# Patient Record
Sex: Female | Born: 1945
Health system: Southern US, Community
[De-identification: ages and names within clinical notes are randomized; demographics above are authoritative.]

## PROBLEM LIST (undated history)

## (undated) DIAGNOSIS — K219 Gastro-esophageal reflux disease without esophagitis: Secondary | ICD-10-CM

## (undated) DIAGNOSIS — Z9889 Other specified postprocedural states: Secondary | ICD-10-CM

## (undated) DIAGNOSIS — I10 Essential (primary) hypertension: Secondary | ICD-10-CM

## (undated) DIAGNOSIS — G709 Myoneural disorder, unspecified: Secondary | ICD-10-CM

## (undated) DIAGNOSIS — H269 Unspecified cataract: Secondary | ICD-10-CM

## (undated) DIAGNOSIS — T7840XA Allergy, unspecified, initial encounter: Secondary | ICD-10-CM

## (undated) DIAGNOSIS — M1990 Unspecified osteoarthritis, unspecified site: Secondary | ICD-10-CM

## (undated) DIAGNOSIS — I739 Peripheral vascular disease, unspecified: Secondary | ICD-10-CM

## (undated) DIAGNOSIS — Z5189 Encounter for other specified aftercare: Secondary | ICD-10-CM

## (undated) DIAGNOSIS — N6019 Diffuse cystic mastopathy of unspecified breast: Secondary | ICD-10-CM

## (undated) DIAGNOSIS — M8580 Other specified disorders of bone density and structure, unspecified site: Secondary | ICD-10-CM

## (undated) DIAGNOSIS — E785 Hyperlipidemia, unspecified: Secondary | ICD-10-CM

## (undated) HISTORY — DX: Allergy, unspecified, initial encounter: T78.40XA

## (undated) HISTORY — PX: ABDOMINAL HYSTERECTOMY: SHX81

## (undated) HISTORY — PX: BREAST SURGERY: SHX581

## (undated) HISTORY — DX: Gastro-esophageal reflux disease without esophagitis: K21.9

## (undated) HISTORY — PX: ORIF SHOULDER FRACTURE: SHX5035

## (undated) HISTORY — DX: Myoneural disorder, unspecified: G70.9

## (undated) HISTORY — PX: TONSILLECTOMY: SUR1361

## (undated) HISTORY — DX: Encounter for other specified aftercare: Z51.89

## (undated) HISTORY — PX: HAND SURGERY: SHX662

## (undated) HISTORY — PX: VEIN SURGERY: SHX48

## (undated) HISTORY — DX: Unspecified cataract: H26.9

## (undated) HISTORY — PX: COLONOSCOPY: SHX174

## (undated) HISTORY — PX: OTHER SURGICAL HISTORY: SHX169

## (undated) HISTORY — DX: Hyperlipidemia, unspecified: E78.5

## (undated) HISTORY — DX: Unspecified osteoarthritis, unspecified site: M19.90

## (undated) HISTORY — DX: Other specified disorders of bone density and structure, unspecified site: M85.80

## (undated) HISTORY — PX: ANKLE SURGERY: SHX546

---

## 1985-03-05 HISTORY — PX: BREAST EXCISIONAL BIOPSY: SUR124

## 1998-05-09 ENCOUNTER — Ambulatory Visit: Admission: RE | Admit: 1998-05-09 | Discharge: 1998-05-09 | Payer: Self-pay | Admitting: *Deleted

## 1998-05-09 ENCOUNTER — Encounter: Payer: Self-pay | Admitting: *Deleted

## 1998-06-15 ENCOUNTER — Ambulatory Visit: Admission: RE | Admit: 1998-06-15 | Discharge: 1998-06-15 | Payer: Self-pay | Admitting: *Deleted

## 1998-06-15 ENCOUNTER — Encounter: Payer: Self-pay | Admitting: *Deleted

## 1998-06-28 ENCOUNTER — Emergency Department (HOSPITAL_COMMUNITY): Admission: EM | Admit: 1998-06-28 | Discharge: 1998-06-28 | Payer: Self-pay | Admitting: Emergency Medicine

## 1998-07-01 ENCOUNTER — Emergency Department (HOSPITAL_COMMUNITY): Admission: EM | Admit: 1998-07-01 | Discharge: 1998-07-01 | Payer: Self-pay | Admitting: Emergency Medicine

## 1998-12-27 ENCOUNTER — Encounter: Admission: RE | Admit: 1998-12-27 | Discharge: 1998-12-27 | Payer: Self-pay | Admitting: Obstetrics and Gynecology

## 1998-12-27 ENCOUNTER — Encounter: Payer: Self-pay | Admitting: Obstetrics and Gynecology

## 1999-03-31 ENCOUNTER — Emergency Department (HOSPITAL_COMMUNITY): Admission: EM | Admit: 1999-03-31 | Discharge: 1999-03-31 | Payer: Self-pay | Admitting: Emergency Medicine

## 1999-10-31 ENCOUNTER — Ambulatory Visit (HOSPITAL_BASED_OUTPATIENT_CLINIC_OR_DEPARTMENT_OTHER): Admission: RE | Admit: 1999-10-31 | Discharge: 1999-10-31 | Payer: Self-pay | Admitting: Orthopedic Surgery

## 1999-11-08 ENCOUNTER — Emergency Department (HOSPITAL_COMMUNITY): Admission: EM | Admit: 1999-11-08 | Discharge: 1999-11-08 | Payer: Self-pay | Admitting: Emergency Medicine

## 2000-06-17 ENCOUNTER — Encounter: Payer: Self-pay | Admitting: Obstetrics and Gynecology

## 2000-06-17 ENCOUNTER — Encounter: Admission: RE | Admit: 2000-06-17 | Discharge: 2000-06-17 | Payer: Self-pay | Admitting: Obstetrics and Gynecology

## 2000-06-18 ENCOUNTER — Encounter: Payer: Self-pay | Admitting: Obstetrics and Gynecology

## 2000-06-18 ENCOUNTER — Encounter: Admission: RE | Admit: 2000-06-18 | Discharge: 2000-06-18 | Payer: Self-pay | Admitting: Obstetrics and Gynecology

## 2001-01-16 ENCOUNTER — Encounter: Payer: Self-pay | Admitting: Obstetrics and Gynecology

## 2001-01-16 ENCOUNTER — Encounter: Admission: RE | Admit: 2001-01-16 | Discharge: 2001-01-16 | Payer: Self-pay | Admitting: Obstetrics and Gynecology

## 2001-04-24 ENCOUNTER — Other Ambulatory Visit: Admission: RE | Admit: 2001-04-24 | Discharge: 2001-04-24 | Payer: Self-pay | Admitting: Obstetrics and Gynecology

## 2001-06-02 ENCOUNTER — Inpatient Hospital Stay (HOSPITAL_COMMUNITY): Admission: EM | Admit: 2001-06-02 | Discharge: 2001-06-05 | Payer: Self-pay | Admitting: Emergency Medicine

## 2001-06-02 ENCOUNTER — Encounter: Payer: Self-pay | Admitting: *Deleted

## 2001-06-03 ENCOUNTER — Encounter: Payer: Self-pay | Admitting: Family Medicine

## 2001-07-01 ENCOUNTER — Ambulatory Visit: Admission: RE | Admit: 2001-07-01 | Discharge: 2001-07-01 | Payer: Self-pay | Admitting: Family Medicine

## 2002-10-13 ENCOUNTER — Ambulatory Visit (HOSPITAL_COMMUNITY): Admission: RE | Admit: 2002-10-13 | Discharge: 2002-10-13 | Payer: Self-pay | Admitting: Orthopedic Surgery

## 2002-10-13 ENCOUNTER — Encounter: Payer: Self-pay | Admitting: Orthopedic Surgery

## 2002-10-16 ENCOUNTER — Encounter: Admission: RE | Admit: 2002-10-16 | Discharge: 2002-10-16 | Payer: Self-pay | Admitting: Orthopedic Surgery

## 2002-10-16 ENCOUNTER — Encounter: Payer: Self-pay | Admitting: Orthopedic Surgery

## 2002-12-03 ENCOUNTER — Encounter: Payer: Self-pay | Admitting: Obstetrics and Gynecology

## 2002-12-03 ENCOUNTER — Encounter: Admission: RE | Admit: 2002-12-03 | Discharge: 2002-12-03 | Payer: Self-pay | Admitting: Obstetrics and Gynecology

## 2003-05-29 ENCOUNTER — Emergency Department (HOSPITAL_COMMUNITY): Admission: AD | Admit: 2003-05-29 | Discharge: 2003-05-29 | Payer: Self-pay | Admitting: Family Medicine

## 2004-01-28 ENCOUNTER — Encounter: Admission: RE | Admit: 2004-01-28 | Discharge: 2004-01-28 | Payer: Self-pay | Admitting: Obstetrics and Gynecology

## 2004-07-08 ENCOUNTER — Emergency Department (HOSPITAL_COMMUNITY): Admission: EM | Admit: 2004-07-08 | Discharge: 2004-07-08 | Payer: Self-pay | Admitting: Emergency Medicine

## 2004-07-27 ENCOUNTER — Ambulatory Visit (HOSPITAL_COMMUNITY): Admission: RE | Admit: 2004-07-27 | Discharge: 2004-07-27 | Payer: Self-pay | Admitting: Orthopedic Surgery

## 2004-07-27 ENCOUNTER — Ambulatory Visit (HOSPITAL_BASED_OUTPATIENT_CLINIC_OR_DEPARTMENT_OTHER): Admission: RE | Admit: 2004-07-27 | Discharge: 2004-07-27 | Payer: Self-pay | Admitting: Orthopedic Surgery

## 2005-11-14 ENCOUNTER — Encounter: Admission: RE | Admit: 2005-11-14 | Discharge: 2005-11-14 | Payer: Self-pay | Admitting: Obstetrics and Gynecology

## 2006-04-11 ENCOUNTER — Emergency Department (HOSPITAL_COMMUNITY): Admission: EM | Admit: 2006-04-11 | Discharge: 2006-04-11 | Payer: Self-pay | Admitting: Family Medicine

## 2006-07-09 ENCOUNTER — Inpatient Hospital Stay (HOSPITAL_COMMUNITY): Admission: EM | Admit: 2006-07-09 | Discharge: 2006-07-11 | Payer: Self-pay | Admitting: Emergency Medicine

## 2006-10-08 ENCOUNTER — Encounter: Admission: RE | Admit: 2006-10-08 | Discharge: 2007-01-06 | Payer: Self-pay | Admitting: Specialist

## 2006-11-18 ENCOUNTER — Encounter: Admission: RE | Admit: 2006-11-18 | Discharge: 2006-11-18 | Payer: Self-pay | Admitting: Obstetrics and Gynecology

## 2007-05-12 ENCOUNTER — Ambulatory Visit: Payer: Self-pay | Admitting: Gastroenterology

## 2007-05-21 ENCOUNTER — Emergency Department (HOSPITAL_COMMUNITY): Admission: EM | Admit: 2007-05-21 | Discharge: 2007-05-21 | Payer: Self-pay | Admitting: Emergency Medicine

## 2007-06-02 ENCOUNTER — Ambulatory Visit: Payer: Self-pay | Admitting: Gastroenterology

## 2007-09-17 ENCOUNTER — Encounter (INDEPENDENT_AMBULATORY_CARE_PROVIDER_SITE_OTHER): Payer: Self-pay | Admitting: Cardiology

## 2007-09-17 ENCOUNTER — Ambulatory Visit (HOSPITAL_COMMUNITY): Admission: RE | Admit: 2007-09-17 | Discharge: 2007-09-17 | Payer: Self-pay | Admitting: Cardiology

## 2007-09-19 ENCOUNTER — Ambulatory Visit (HOSPITAL_COMMUNITY): Admission: RE | Admit: 2007-09-19 | Discharge: 2007-09-19 | Payer: Self-pay | Admitting: Cardiology

## 2007-10-02 ENCOUNTER — Ambulatory Visit (HOSPITAL_COMMUNITY): Admission: RE | Admit: 2007-10-02 | Discharge: 2007-10-02 | Payer: Self-pay | Admitting: Cardiology

## 2007-10-10 ENCOUNTER — Ambulatory Visit (HOSPITAL_BASED_OUTPATIENT_CLINIC_OR_DEPARTMENT_OTHER): Admission: RE | Admit: 2007-10-10 | Discharge: 2007-10-10 | Payer: Self-pay | Admitting: Cardiology

## 2007-10-18 ENCOUNTER — Ambulatory Visit: Payer: Self-pay | Admitting: Internal Medicine

## 2007-11-25 ENCOUNTER — Encounter: Admission: RE | Admit: 2007-11-25 | Discharge: 2007-11-25 | Payer: Self-pay | Admitting: Obstetrics and Gynecology

## 2008-11-25 ENCOUNTER — Encounter: Admission: RE | Admit: 2008-11-25 | Discharge: 2008-11-25 | Payer: Self-pay | Admitting: Obstetrics and Gynecology

## 2009-10-31 ENCOUNTER — Ambulatory Visit: Admission: RE | Admit: 2009-10-31 | Discharge: 2009-10-31 | Payer: Self-pay | Admitting: Emergency Medicine

## 2009-10-31 ENCOUNTER — Ambulatory Visit: Payer: Self-pay | Admitting: Vascular Surgery

## 2009-11-28 ENCOUNTER — Encounter: Admission: RE | Admit: 2009-11-28 | Discharge: 2009-11-28 | Payer: Self-pay | Admitting: Obstetrics and Gynecology

## 2009-12-16 ENCOUNTER — Ambulatory Visit (HOSPITAL_COMMUNITY): Admission: RE | Admit: 2009-12-16 | Discharge: 2009-12-16 | Payer: Self-pay | Admitting: Specialist

## 2010-02-09 ENCOUNTER — Emergency Department (HOSPITAL_COMMUNITY): Admission: EM | Admit: 2010-02-09 | Discharge: 2009-10-31 | Payer: Self-pay | Admitting: Emergency Medicine

## 2010-03-25 ENCOUNTER — Encounter: Payer: Self-pay | Admitting: Obstetrics and Gynecology

## 2010-03-25 ENCOUNTER — Encounter: Payer: Self-pay | Admitting: Specialist

## 2010-07-18 NOTE — Cardiovascular Report (Signed)
NAMEMarland Kitchen  LEIANN, SPORER NO.:  [id number]   MEDICAL RECORD NO.:  [id number]          PATIENT TYPE:  OIB   LOCATION:  2899                         FACILITY:  [facility]   PHYSICIAN:  Madaline Savage, M.D.DATE OF BIRTH:  08-21-1945   DATE OF PROCEDURE:  10/02/2007  DATE OF DISCHARGE:  10/02/2007                            CARDIAC CATHETERIZATION   PATIENT'S PROFILE:  The patient is a 65 year old African American female  patient of Dr. Yates Decamp, who has hypertension and hyperlipidemia.  She  has a history of IVP CONTRAST allergy which in the past has caused  shaking.  She also has PENICILLIN, CODEINE, and ALEVE allergies.  Recently, Dr. Jacinto Halim felt that the patient was having ongoing chest pain  and exertional shortness of breath with a decreased ejection fraction of  45% and an abnormal stress test.  It was for that reason that he wanted  this cardiac catheterization done today which was completed from the  right percutaneous femoral artery approach and venous approach without  any complications.  The results are as follows.  Central aortic pressure  was 130/75 with a mean of 100, central left ventricular pressure was  130/6, end-diastolic pressure 13, right atrial mean pressure was 10,  right ventricular pressure was 35/5 with a mean of 10.  Pulmonary artery  pressure was 35/15 with a mean pressure of 21.  The wedge pressure was  mean of 11, A-wave 13, V-wave 14.   ANGIOGRAPHIC RESULTS:  The patient's left main coronary artery was  normal.  The LAD courses the cardiac apex giving rise to one very large  diagonal branch.  All vessels and LAD system appear normal.  The left  circumflex coronary artery is a large vessel giving rise to a  trifurcating obtuse marginal branch and the ongoing branch of the left  circumflex.  The atrioventricular groove is normal as well as the obtuse  marginal branch that trifurcates.   The right coronary artery is a small nondominant vessel  which has  luminal irregularities, but no significant disease.   The left ventricle shows vigorous contractility of all wall segments.  I  would estimate ejection fraction at 60-65% with no mitral regurgitation.   FINAL IMPRESSIONS:  1. Angiographically, patent coronary artery with a circumflex dominant      circulation, angiographically patent vessels.  2. Mild elevation of pulmonary artery pressures.  3. Normal left ventricular systolic function.           ______________________________  Madaline Savage, M.D.     WHG/MEDQ  D:  10/02/2007  T:  10/03/2007  Job:  [id number]   cc:   Stacie Acres. Cliffton Asters, M.D.  Cristy Hilts. Jacinto Halim, MD

## 2010-07-21 NOTE — Op Note (Signed)
NAMEMarland Kitchen  Marie, [name] NO.:  [id number]   MEDICAL RECORD NO.:  [id number]          PATIENT TYPE:  INP   LOCATION:  1614                         FACILITY:  [facility]   PHYSICIAN:  Jene Every, M.D.    DATE OF BIRTH:  November 21, 1945   DATE OF PROCEDURE:  07/09/2006  DATE OF DISCHARGE:                               OPERATIVE REPORT   PREOPERATIVE DIAGNOSIS:  Bimalleolar ankle fracture, left.   POSTOPERATIVE DIAGNOSIS:  Bimalleolar ankle fracture, left.   PROCEDURE PERFORMED:  1. Open reduction and internal fixation bimalleolar ankle fracture.  2. Local bone grafting of the medial malleolus.  3. Stress radiographs.   BRIEF HISTORY AND INDICATIONS:  This is a 65 year old who sustained a  bimalleolar closed ankle fracture today.  She was indicated for open  reduction and internal fixation due to the disruption of the mortise.  The risks and benefits were discussed including bleeding, infection,  damage to neurovascular structures, nonunion, malunion, post traumatic  arthrosis, need for revision, etc.   SURGICAL TECHNIQUE:  With the patient in the supine position, after  adequate general anesthesia and 2 gram Kefzol, the left lower extremity  was prepped and draped in the usual sterile fashion.  The thigh  tourniquet was inflated to 300 mmHg.  An incision was made over the  lateral malleolus.  The subcutaneous tissue was dissected.  Electrocautery was utilized to achieve hemostasis.  Blunt dissection  down to the fracture site sparing the branch of the superficial peroneal  nerve which were identified and protected.  An oblique fracture was  identified, irrigated, curetted, reduced with a tenaculum anatomically,  and a lag screw was placed anteriorly to posteriorly, cephalad to  caudad.  It was over drilled proximally with a 3.5 drill and then  distally with a 2.5 drill.  A 24  mm full threaded cortical screw was  advanced.  Fracture hematoma expressed with excellent  purchase.  I then  protected that with a six hole 1/3 tibial plate contoured affixed  distally with two fully threaded cancellous screws and proximally with  three fully threaded cortical screws after the appropriate depth gauge  with insertion of the appropriate length screw with excellent purchase.   Attention was turned medially.  I made a curvilinear incision over the  medial malleolus.  The subcutaneous tissue was dissected.  Electrocautery was utilized to achieve hemostasis.  The fracture was  identified.  It was opened and curetted.  Two planes of the fracture  obliquely along the medial malleolus and off the anterior medial cortex  which is significantly comminuted.  We opened up the joint, irrigated  the fragments from the joint.  The cartilage surfaces, however, appeared  to be intact.  The medial malleolar fragment was reduced after the  periosteum was reflected out of the fracture reduced and two 4-0  partially threaded cancellous screws were then advanced to affix the  medial malleolar fracture after the appropriate drilling, tapping, and  insertion of the screw with excellent purchase.  This was on the AP and  lateral plane.  Anteriorly, there was dense comminution noted at this  anterior medial column.  I rongeured some of the bone fragments and  placed it into the fracture site underneath the periosteum and then  reflected the periosteum over top of that.  The remainder of it was  stable.  We had good dorsiflexion and plantar flexion of the foot.  Stress radiographs were obtained and were satisfactory with excellent  reduction of the mortise in the AP and lateral plane.  The wound was  copiously irrigated after the joint was copiously irrigated.  I repaired  both wounds, the subcutaneous tissue with 2-0 Vicryl simple suture and  the skin was reapproximated with staples.  Again, laterally, we closed  the wound without incarcerating the branch of the superficial or   peroneal nerve.  The wound was dressed sterilely.  The tourniquet was  deflated with adequate vascular sensation of the lower extremity  appreciated.  A short leg fiberglass cast was applied, well molded, with  cold water.  After the appropriate curing of the cast, she was extubated  without difficulty and transferred to the recovery room in satisfactory  condition.  The patient tolerated the procedure well, there were no  complications.      Jene Every, M.D.  Electronically Signed     JB/MEDQ  D:  07/09/2006  T:  07/09/2006  Job:  [id number]

## 2010-07-21 NOTE — Op Note (Signed)
NAMEMarland Kitchen  KENNADI, ALBANY             ACCOUNT NO.:  [id number]   MEDICAL RECORD NO.:  [id number]          PATIENT TYPE:  AMB   LOCATION:  DSC                          FACILITY:  [facility]   PHYSICIAN:  Katy Fitch. Sypher, M.D. DATE OF BIRTH:  09/08/1945   DATE OF PROCEDURE:  07/28/2003  DATE OF DISCHARGE:                                 OPERATIVE REPORT   PREOPERATIVE DIAGNOSIS:  Chronic foreign body granuloma, right volar wrist,  following episode of working of the yard.   POSTOPERATIVE DIAGNOSIS:  A retained thorn adjacent the flexor carpi  radialis.   OPERATION:  Resection of retained thorn and debridement of foreign body  granuloma, right volar wrist.   OPERATING SURGEON:  Katy Fitch. Sypher, M.D.   ASSISTANT:  Molly Maduro Dasnoit PA-C.   ANESTHESIA:  025% Marcaine with 2% lidocaine field block supplemented by IV  sedation.   SUPERVISING ANESTHESIOLOGIST:  Janetta Hora. Gelene Mink, M.D.   INDICATIONS:  Kameah Rawl is a 65 year old woman referred for evaluation  and management of a mass on the volar aspect of wrist with signs of by  retained foreign body.   She had soaked and was unable to cause a pointing of this lesion.   She was advised to proceed with resection of her foreign body at this time.   PROCEDURE:  Faren Florence was brought to the operating room and placed in  supine position upon the operating table.  Following light sedation, the  right arm was prepped with Betadine soap and solution and sterilely draped.  Following exsanguination of the limb with an Esmarch bandage, an arterial  tourniquet on the proximal brachium was inflated to 220 mmHg.   Procedure commenced with infiltration of 0.25% Marcaine and 2% lidocaine  into the path of the intended incision and around the flexor carpi radialis  tendon.  When anesthesia was satisfactory, procedure commenced with an  elliptical excision of the granuloma.  A throne approximately 1.5 mm in  diameter and approximately 6 mm  in length was removed without difficulty.  The wound was then curetted with a microcurette and irrigated.  There wound  was closed with a simple mattress suture of 5-0 nylon.   A compressive dressing was applied with Xeroflo, sterile gauze and Ace wrap.  There were no apparent complications.      RVS/MEDQ  D:  07/27/2004  T:  07/28/2004  Job:  [id number]

## 2010-07-21 NOTE — Cardiovascular Report (Signed)
[place]. [facility]  Patient:    Marie [name], Marie [name] Visit Number: [id number] MRN: [id number]          Service Type: MED Location: 3W [phone] Attending Physician:  Judie Petit Dictated by:   Madaline Savage, M.D. Proc. Date: 06/03/01 Admit Date:  06/02/2001   CC:         Lorelle Formosa, M.D.  Cardiac Catheterization Laboratory  [facility]   Cardiac Catheterization  PROCEDURES PERFORMED: 1. Selective coronary angiography by Judkins technique. 2. Retrograde left heart catheterization. 3. Left ventricular angiography.  COMPLICATIONS: None.  ENTRY SITE: Right femoral.  DYE USED: Omnipaque.  PATIENT PROFILE: The patient is a 65 year old, African American female with a strong family history of coronary artery disease and two siblings, who has had chest pain for three weeks. Cardiac enzymes have been negative and ECG have not shown obvious ischemia. The patient presents today in transfer from [facility] to [facility] for inpatient cardiac catheterization given her duration of symptoms, the quality of her pain and her family history. This procedure is performed electively.  RESULTS:  PRESSURES: The left ventricular pressure was 140/20, central aortic pressure 140/70 with a mean of 100.  No aortic valve gradient by pullback technique.  ANGIOGRAPHIC RESULTS: The left main coronary artery is normal.  The left anterior descending coronary artery gives rise to a diagonal branch arising after the first septal perforator branch. No lesions are seen.  The circumflex is a dominant vessel giving rise to a trifurcating obtuse marginal branch #1. The circumflex and the OM-1 are both normal.  The right coronary artery is nondominant and contains no significant lesions. The left ventricle contracts vigorously and normally with a left ventricular ejection fraction of 57%.  FINAL DIAGNOSES: 1. Angiographically  patent coronary arteries. 2. Well preserved left ventricular systolic function without mitral    regurgitation. Dictated by:   Madaline Savage, M.D. Attending Physician:  Judie Petit DD:  06/03/01 TD:  06/03/01 Job: [id number]

## 2010-07-21 NOTE — Procedures (Signed)
NAME:  Marie [name], DEMO NO.:  [id number]   MEDICAL RECORD NO.:  [id number]          PATIENT TYPE:  OUT   LOCATION:  SLEEP CENTER                 FACILITY:  [facility]   PHYSICIAN:  Clinton D. Maple Hudson, MD, FCCP, FACPDATE OF BIRTH:  03[birth year]10-1945   DATE OF STUDY:                            NOCTURNAL POLYSOMNOGRAM   REFERRING PHYSICIAN:  Vonna Kotyk R. Jacinto Halim, MD   INDICATION FOR STUDY:  Insomnia with sleep apnea.   EPWORTH SLEEPINESS SCORE:  8/24.  BMI 32.6, weight 208 pounds, height 67  inches, neck 16 inches.   MEDICATIONS:  Home medications are charted and reviewed.   SLEEP ARCHITECTURE:  Total sleep time 306 minutes with sleep efficiency  66.4%.  Stage I was 6.2%.  Stage II 75.3%.  Stage III 9.3%.  REM 9.2% of  total sleep time.  Sleep latency 38 minutes.  REM latency 299 minutes.  Awake after sleep onset 117 minutes.  Arousal index 25.1.  No bedtime  medications were taken.  Sleep architecture is marked by frequent  sustained waking throughout much of the study night until 2 a.m.  Review  of video recording is consistent with frequent position changes, turning  light on and off, putting glasses on and off.   RESPIRATORY DATA:  Apnea/hypopnea index (AHI) 3.9 per hour.  Respiratory  disturbance index (RDI) 6.7 per hour.  Twenty events were scored  including 5 obstructive apneas and 15 hypopneas.  Events were more  common while nonsupine.  REM AHI 15 per hour.   OXYGEN DATA:  Technician did not note snoring.  Oxygen desaturation  nadir 81% with mean oxygen saturation on room air through the study at  93.6%.   CARDIAC DATA:  Normal sinus rhythm.   MOVEMENT-PARASOMNIA:  No significant movement disturbance.  No bathroom  trips.   IMPRESSIONS-RECOMMENDATIONS:  1. Sleep fragmented by frequent nonspecific wakings throughout the      study night consistent with the patient's history and supporting an      impression of insomnia.  2. Occasional respiratory events effecting sleep,  AHI is 3.9 per hour      (normal range 0-5 per hour).  Events were      more common while nonsupine and associated with negligible snoring      and desaturation to a nadir of 81%.  3. Suggest management of his insomnia.      Clinton D. Maple Hudson, MD, FCCP, FACP  Diplomate, Biomedical engineer of Sleep Medicine  Electronically Signed     CDY/MEDQ  D:  10/18/2007 11:31:17  T:  10/18/2007 11:44:22  Job:  [id number]

## 2010-07-21 NOTE — Discharge Summary (Signed)
NAMEMarland Kitchen  Marie [name], Marie [name] NO.:  [id number]   MEDICAL RECORD NO.:  [id number]          PATIENT TYPE:  INP   LOCATION:  1614                         [facility]ACILITY:  [facility]   PHYSICIAN:  Jene Every, M.D.    DATE OF BIRTH:  04-09-1945   DATE OF ADMISSION:  07/09/2006  DATE OF DISCHARGE:  07/11/2006                               DISCHARGE SUMMARY   ADMISSION DIAGNOSIS:  Left bimalleolar ankle fracture.   DISCHARGE DIAGNOSIS:  Left bimalleolar ankle fracture, status post open  reduction and internal fixation of the left ankle.   HISTORY:  The patient is a pleasant 65 year old female who works here at  [employer].  She is Agricultural engineer.  She was on  call to a code when she slipped and fell with immediate onset of left  ankle pain.  She was evaluated emergency room by Dr. Shelle Iron.  Diagnosed  with a bimalleolar ankle fracture and was recommended at this time she  undergo ORIF of ankle. The risks and benefits of this were discussed.  The patient does elect to proceed.   PROCEDURE:  The patient was taken to the OR, underwent ORIF of the left  ankle. Surgeon Dr. Jene Every, assistant Roma Schanz.  Anesthesia general.  Complications none.   CONSULTANT:  PT and OT.   PREOPERATIVE LABS:  Were obtained while in the emergency department. CBC  was within normal range.  Hemoglobin 13.4 and hematocrit 39.8.  Routine  chemistries were normal; sodium 140, potassium 3.7, slightly elevated  glucose of 104, normal renal function.  Preoperative EKG showed normal  sinus rhythm.  Preoperative chest x-ray showed no acute findings.   HOSPITAL COURSE:  The patient was admitted, taken to the OR and  underwent the above stated procedure without difficulty.  She was then  transferred to PACU and then to the orthopedic floor for continued  postoperative care.  Postoperatively the patient was doing very well.  She did note some discomfort in lower extremity as  expected. She had not  been no utilizing her PCA nor was she requesting pain medication. Vital  signs remained stable.  Cast was clean, dry and intact and well-fitting.  Motor and neurovascular function was intact of lower extremity. It was  felt at this point we could discontinue her PCA.  We encouraged her to  request pain medications. A PT, OT was on consulted, discharge planning  was initiated with continued aspirin for DVT prophylaxis.  Postop day #2  the patient was doing very well.  The pain was controlled on p.o.  analgesics.  Vital signs remained stable.  Cast remained well-fitting.  At this point the patient was ready to be discharged home.   DISPOSITION:  The patient discharged home with advanced home care.  She  is to follow with Dr. Shelle Iron in 10-14 days for cast removal and suture  removal followed by x-ray. She is to keep her cast clean and dry.  We  discussed appropriate cast care.  She is to ice and elevate 6 times a  day for 20 minutes' time.  She is to be  non-weightbearing to left lower  extremity.   DISCHARGE MEDICATIONS:  1. Darvocet 1-2 p.o. q.4-6 p.r.n. pain.  2. Toradol 10 mg one p.o. q.6 p.r.n. pain for 5 days.  3. Aspirin 325 mg daily for DVT prophylaxis.   CONDITION ON DISCHARGE:  Stable.   DIET:  Is as tolerated.   FINAL DIAGNOSIS:  Doing well status post ORIF the left ankle.      Roma Schanz, P.A.      Jene Every, M.D.  Electronically Signed    CS/MEDQ  D:  08/21/2006  T:  08/21/2006  Job:  [id number]

## 2010-07-21 NOTE — Consult Note (Signed)
[facility]  Patient:    Marie [name], Marie [name] Visit Number: [id number] MRN: [id number]          Service Type: MED Location: 3W [phone] Attending Physician:  Judie Petit Dictated by:   Roosvelt Harps, M.D. Proc. Date: 06/04/01 Admit Date:  06/02/2001   CC:         Lorelle Formosa, M.D.   Consultation Report  Marie [name] is a 65 year old female whom I am asked to see for atypical chest pain.  She was admitted to the hospital on the 31st complaining of severe aching chest pain which was relieved by nitroglycerin somewhat.  Previous to the onset of aching chest discomfort she had had some sharp left-sided rib area pain.  She had been taking Motrin 800 mg pr.n. chest and back discomfort. She has a history of mild reflux relieved with Maalox and she was not on any specific H2 blockers or PPIs.  In hospital she has been evaluated extensively. She had normal electrocardiogram, normal chest x-ray, normal ultrasonogram. She underwent cardiac catheterization that demonstrates normal coronary arteries.  She has normal liver enzymes and normal cardiac enzymes.  She has had no vomiting, dysphagia, or weight loss.  PAST MEDICAL HISTORY:  Pertinent for postmenopausal state, hypertension, hyperlipidemia, and seasonal allergies.  PAST SURGICAL HISTORY:  Hysterectomy, lumpectomy, varicose vein stripping.  CURRENT MEDICATIONS: 1. Premarin 0.625 mg q.d. 2. Protonix 40 mg q.d. 3. Lopressor 12.5 mg b.i.d. 4. Altace 2.5 mg q.d. 5. Zocor 20 mg q.d. 6. Nitroglycerin p.r.n. 7. Zyrtec D 12 hour b.i.d.  ALLERGIES:  PENICILLIN, DEMEROL.  FAMILY HISTORY:  Noncontributory.  No known ulcers, gallstones, inflammatory bowel disease, or colorectal cancer, although her father did die of throat cancer.  SOCIAL HISTORY:  Former smoker.  Nondrinker.  REVIEW OF SYSTEMS:  GENERAL:  No weight loss or night sweats.  ENDOCRINE:  No diabetes or thyroid problems.   SKIN:  No rash or pruritus.  HEENT:  Eyes:  No icterus or change in vision.  ENT:  No aphthous ulcers or chronic sore throat. RESPIRATORY:  No shortness of breath, cough, or wheezing.  CARDIAC:  As above. GASTROINTESTINAL:  As above.  GENITOURINARY:  No dysuria or hematuria.  PHYSICAL EXAMINATION  GENERAL:  She is a well-developed, well-nourished adult female in no acute distress.  VITAL SIGNS:  Afebrile.  Blood pressure 106/67, pulse 61 and regular.  SKIN:  Normal.  HEENT:  Eyes:  Anicteric.  Oropharynx:  Unremarkable.  NECK:  Supple without thyromegaly or adenopathy.  CHEST:  Clear.  HEART:  Regular rate and rhythm.  ABDOMEN:  Soft with normal bowel sounds and without mass, tenderness, or organomegaly.  There is a healed hysterectomy scar.  The left lower rib and sternal area is somewhat tender to deep palpation.  RECTAL:  Not performed.  EXTREMITIES:  Without clubbing, cyanosis, edema, or rash.  LABORATORIES:  Hemoglobin 13.1, white blood count 12.8, platelet count normal. Electrolytes normal.  IMPRESSION:  A 65 year old female with atypical chest discomfort and normal cd evaluation.  She does have some degree of underlying reflux.  Differential diagnoses here would be reflux versus esophageal spasm versus costochondritis.  PLAN:  Upper endoscopy is suggested to the patient to evaluate for signs of reflux or hiatal hernia.  The procedure is reviewed in terms of technique, preparation, risks, and complications including bleeding and perforation.  She agrees to proceed.  It will be performed tomorrow afternoon.  Please see the orders. Dictated by:   Theron Arista  Luther Parody, M.D. Attending Physician:  Judie Petit DD:  06/04/01 TD:  06/05/01 Job: [id number]

## 2010-07-21 NOTE — Op Note (Signed)
[place]. [facility]  Patient:    Marie [name], Marie [name]                    MRN: [id number] Proc. Date: 10/31/99 Adm. Date:  [id number] Disc. Date: [id number] Attending:  Susa Day                           Operative Report  2PREOPERATIVE DIAGNOSIS:  Stenosing tenosynovitis, left flexor pollicis longus at A1 pulley.  POSTOPERATIVE DIAGNOSIS:  Stenosing tenosynovitis, left flexor pollicis longus at A1 pulley.  OPERATION:  Release of left thumb A1 pulley.  SURGEON:  Katy Fitch. Sypher, Montez Hageman., M.D.  ASSISTANT:  Marveen Reeks Dasnoit, P.A.-C  ANESTHESIA:  0.25% Marcaine and 2% lidocaine, metacarpal head level block, left thumb.  Supplemental IV sedation.  SUPERVISING ANESTHESIOLOGIST:  Kaylyn Layer. Michelle Piper, M.D.  INDICATIONS:  Marie [name] is a 65 year old woman who has had longstanding stenosing tenosynovitis.  She has had a history of increasing difficulty with flexion of the IP joint and triggering.  Due to failure to respond to nonoperative measures, she is brought to the operating room at this time for release of her left thumb A1 pulley.  DESCRIPTION OF PROCEDURE:  Aliahna Statzer was brought to the operating room and placed in supine position upon the operating table.  Following placement of the 0.25% Marcaine and 2% lidocaine metacarpal head-level block, the left arm was prepped with DuraPrep and draped sterilely.  Following exsanguination of the limb with an Esmarch bandage, the arterial tourniquet was inflated to 230 mmHg.  The procedure commenced with a short transverse incision in the proximal thumb flexion crease.  CAre was taken to identify the flexor sheath and the proper digital nerve.  The proper digital nerve was gently retracted with a blunt retractor.  The pulley was split with scalpel and extended proximally and distally with scissors.  Thereafter, flexor pollicis longus was delivered, found to have a significant area  of swelling.  IP motion was fully recovered actively.  The wound was then repaired with mattress sutures of 5-0 nylon.  A compressive dressing was applied with Xeroflo sterile gauze and Coban.  There were no apparent complications.  For aftercare, Ms. Kruck was given a prescription for [employer] 1 or 2 tablets p.o. q.4-6h. p.r.n. pain, 20 tablets without refill. DD:  10/31/99 TD:  11/06/99 Job: [id number]

## 2010-07-21 NOTE — Discharge Summary (Signed)
[facility]  Patient:    Marie [name], Marie [name] Visit Number: [id number] MRN: [id number]          Service Type: OUT Location: CARD Attending Physician:  Judie Petit Dictated by:   Lorelle Formosa, M.D. Admit Date:  07/01/2001 Discharge Date: 07/01/2001                             Discharge Summary  ADMITTING DIAGNOSES: 1. Substernal chest pain, rule out ischemic heart disease. 2. Menopausal symptoms. 3. Chronic back pain.  DISCHARGE DIAGNOSES: 1. Chest pain, slightly atypical. 2. Chronic low back pain. 3. Hyperglycemia.  DISCHARGE CONDITION:  Stable.  DISCHARGE DISPOSITION:  Follow up in approximately 1-2 weeks.  CONSULTANT:  Madaline Savage, M.D., cardiologist.  HISTORY:  This patient is a 65 year old black woman, who was admitted to the hospital for evaluation of substernal upper chest pain which became more impressive upon her awakening.  She came to work and worked one and a half hours and then went to the emergency room where she was evaluated by Dr. Stacie Acres, emergency department physician.  Past medical history, family history, review of systems, etc., outlined in admission history and physical.  FAMILY HISTORY:  Mother died at age 57 with insulin-dependent diabetes mellitus.  Dad died age 54 with seizures.  She has eight siblings, one with metastatic breast cancer at age 78, one with small cell lung cancer at age 26, a brother with myocardial infarction and coronary artery bypass graft.  A sister with MI at age 18.  The patient was thus admitted for further evaluation.  PHYSICAL EXAMINATION:  VITAL SIGNS:  Blood pressure 157/83, pulse 63, respirations 20, temperature 96.9, height 67 inches, weight 194 pounds.  GENERAL:  The patient was an alert and oriented black woman, lying supine in bed.  HEENT:  Slight conjunctiva, dullness.  EOMs are intact.  Mild ______. Inferior dentition.  Tongue and uvula were midline.   Mucous membranes are moist.  NECK:  Supple.  CHEST:  Clear to auscultation.  HEART:  Regular rate and rhythm with no murmur.  BREASTS:  Normal.  ABDOMEN:  Soft, slightly obese.  PELVIC/RECTAL:  Deferred.  EXTREMITIES:  Old, healed venous ligation and stripping scars of her legs. ______ pedis pulses were fully symmetrical.  NEUROLOGIC:  Grossly physiologic.  LABORATORY DATA:  The patient in ______ had no evidence of cardiac injury on laboratory evaluation with admission CK of 232 with a CK-MB of 5.1.  Repeats being negative.  Lipid profile revealed cholesterol 162 with LDL of 74.  Echocardiogram was normal with left ventricular ejection fraction being 55-65%.  EKG had normal sinus rhythm with sinus arrhythmia.  Cardiac cath revealed normal coronary arteries with LVEF of 67%.  The patient became asymptomatic and was thus discharged for outpatient management with resolved symptoms. Dictated by:   Lorelle Formosa, M.D. Attending Physician:  Judie Petit DD:  07/17/01 TD:  07/20/01 Job: [id number]

## 2010-10-25 ENCOUNTER — Other Ambulatory Visit: Payer: Self-pay | Admitting: Obstetrics and Gynecology

## 2010-10-25 ENCOUNTER — Other Ambulatory Visit: Payer: Self-pay | Admitting: Unknown Physician Specialty

## 2010-10-25 DIAGNOSIS — Z1231 Encounter for screening mammogram for malignant neoplasm of breast: Secondary | ICD-10-CM

## 2010-11-27 LAB — POCT CARDIAC MARKERS
CKMB, poc: 2.6
CKMB, poc: 2.8
Myoglobin, poc: 54.4
Operator id: 3067
Troponin i, poc: <0.05
Troponin i, poc: <0.05

## 2010-11-27 LAB — D-DIMER, QUANTITATIVE: D-Dimer, Quant: 0.37

## 2010-11-27 LAB — BASIC METABOLIC PANEL
BUN: 8
Chloride: 101
GFR calc non Af Amer: >60
Potassium: 3.7
Sodium: 135

## 2010-11-27 LAB — CBC
HCT: 40.8
Hemoglobin: 13.4
MCV: 86.2
Platelets: 270
WBC: 5.7

## 2010-12-01 LAB — POCT I-STAT 3, ART BLOOD GAS (G3+)
Bicarbonate: 25.4 — ABNORMAL HIGH
O2 Saturation: 93
TCO2: 27
pCO2 arterial: 42.1
pH, Arterial: 7.387
pO2, Arterial: 69 — ABNORMAL LOW

## 2010-12-01 LAB — POCT I-STAT 3, VENOUS BLOOD GAS (G3P V)
Acid-base deficit: 1
Bicarbonate: 25 — ABNORMAL HIGH
O2 Saturation: 70
TCO2: 26
pCO2, Ven: 43.7 — ABNORMAL LOW
pH, Ven: 7.365 — ABNORMAL HIGH
pO2, Ven: 38

## 2010-12-04 ENCOUNTER — Ambulatory Visit
Admission: RE | Admit: 2010-12-04 | Discharge: 2010-12-04 | Disposition: A | Discharge status: Home / Self Care | Payer: 59 | Source: Ambulatory Visit | Attending: Obstetrics and Gynecology | Admitting: Obstetrics and Gynecology

## 2010-12-04 DIAGNOSIS — Z1231 Encounter for screening mammogram for malignant neoplasm of breast: Secondary | ICD-10-CM

## 2011-11-15 ENCOUNTER — Other Ambulatory Visit: Payer: Self-pay | Admitting: Obstetrics and Gynecology

## 2011-11-15 DIAGNOSIS — Z1231 Encounter for screening mammogram for malignant neoplasm of breast: Secondary | ICD-10-CM

## 2011-12-11 ENCOUNTER — Ambulatory Visit
Admission: RE | Admit: 2011-12-11 | Discharge: 2011-12-11 | Disposition: A | Discharge status: Home / Self Care | Payer: 59 | Source: Ambulatory Visit | Attending: Obstetrics and Gynecology | Admitting: Obstetrics and Gynecology

## 2011-12-11 DIAGNOSIS — Z1231 Encounter for screening mammogram for malignant neoplasm of breast: Secondary | ICD-10-CM

## 2012-11-12 ENCOUNTER — Other Ambulatory Visit: Payer: Self-pay

## 2012-11-12 DIAGNOSIS — Z1231 Encounter for screening mammogram for malignant neoplasm of breast: Secondary | ICD-10-CM

## 2012-12-11 ENCOUNTER — Ambulatory Visit
Admission: RE | Admit: 2012-12-11 | Discharge: 2012-12-11 | Disposition: A | Discharge status: Home / Self Care | Payer: Medicare Other | Source: Ambulatory Visit

## 2012-12-11 DIAGNOSIS — Z1231 Encounter for screening mammogram for malignant neoplasm of breast: Secondary | ICD-10-CM

## 2013-10-07 ENCOUNTER — Emergency Department (HOSPITAL_COMMUNITY)
Admission: EM | Admit: 2013-10-07 | Discharge: 2013-10-07 | Disposition: A | Discharge status: Home / Self Care | Payer: Medicare Other | Source: Home / Self Care | Attending: Family Medicine | Admitting: Family Medicine

## 2013-10-07 ENCOUNTER — Encounter (HOSPITAL_COMMUNITY): Payer: Self-pay | Admitting: Emergency Medicine

## 2013-10-07 DIAGNOSIS — J069 Acute upper respiratory infection, unspecified: Secondary | ICD-10-CM

## 2013-10-07 DIAGNOSIS — B9789 Other viral agents as the cause of diseases classified elsewhere: Principal | ICD-10-CM

## 2013-10-07 HISTORY — DX: Diffuse cystic mastopathy of unspecified breast: N60.19

## 2013-10-07 HISTORY — DX: Essential (primary) hypertension: I10

## 2013-10-07 LAB — POCT RAPID STREP A: STREPTOCOCCUS, GROUP A SCREEN (DIRECT): NEGATIVE

## 2013-10-07 MED ORDER — IPRATROPIUM BROMIDE 0.06 % NA SOLN: 2.0000 | Freq: Four times a day (QID) | NASAL | Status: DC

## 2013-10-07 MED ORDER — KETOROLAC TROMETHAMINE 30 MG/ML IJ SOLN
INTRAMUSCULAR | Status: AC
  Filled 2013-10-07: qty 1

## 2013-10-07 MED ORDER — KETOROLAC TROMETHAMINE 60 MG/2ML IM SOLN
30.0000 mg | Freq: Once | INTRAMUSCULAR | Status: AC
  Administered 2013-10-07: 30 mg via INTRAMUSCULAR

## 2013-10-07 MED ORDER — GUAIFENESIN-CODEINE 100-10 MG/5ML PO SOLN: 5.0000 mL | Freq: Three times a day (TID) | ORAL | Status: DC | PRN

## 2013-10-07 NOTE — Discharge Instructions (Signed)
You likely have a viral cold Your strep test was negative Please start using ibuprofen in 24 hours (600mg  every 6 hours) Please get lots of rest adn stay well hydrated.  Please use the atrovent to help with the nasal drainage.  If you are not better in another 5 days or get significantly worse than call or come back.  You can try Delsym for the cough during the day if needed.

## 2013-10-07 NOTE — ED Notes (Signed)
Pt  Reports  Symptoms  Of  sorethroat  With  Cough  /  Congestion          With  onst  Of  Symptoms  X  sev  Days       Pt reports  It  Hurts  When she  Swallows        She  Reports  For the  Most  Part  The  Cough is  Non productive

## 2013-10-07 NOTE — ED Provider Notes (Signed)
CSN: [id number]     Arrival date & time 10/07/13  0844 History   First MD Initiated Contact with Patient 10/07/13 0913     Chief Complaint  Patient presents with  . Sore Throat   (Consider location/radiation/quality/duration/timing/severity/associated sxs/prior Treatment) HPI  Sore throat: started 2 days ago. Getting worse. Swollen lymphnodes in neck. Deneis fevers, rash, n/v/d. 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 Developed HA today. Started runny nose and cough last night which kept pt awake. Cough drops w/o much benefit.   Past Medical History  Diagnosis Date  . Hypertension   . Fibrocystic breast    Past Surgical History  Procedure Laterality Date  . Tonsillectomy    . Ankle surgery    . Vein surgery    . Breast surgery    . Abdominal hysterectomy     History reviewed. No pertinent family history. History  Substance Use Topics  . Smoking status: Never Smoker   . Smokeless tobacco: Not on file  . Alcohol Use: No   OB History   Grav Para Term Preterm Abortions TAB SAB Ect Mult Living                 Review of Systems Per HPI with all other pertinent systems negative.   Allergies  Cholesterol; Hydrocodone; and Penicillins  Home Medications   Prior to Admission medications   Medication Sig Start Date End Date Taking? Authorizing Provider  aspirin 81 MG tablet Take 81 mg by mouth daily.   Yes Historical Provider, MD  hydrochlorothiazide (HYDRODIURIL) 50 MG tablet Take 50 mg by mouth daily.   Yes Historical Provider, MD  potassium chloride (K-DUR) 10 MEQ tablet Take 10 mEq by mouth daily.   Yes Historical Provider, MD  guaiFENesin-codeine 100-10 MG/5ML syrup Take 5 mLs by mouth 3 (three) times daily as needed for cough. 10/07/13   Waldemar Dickens, MD  ipratropium (ATROVENT) 0.06 % nasal spray Place 2 sprays into both nostrils 4 (four) times daily. 10/07/13   Waldemar Dickens, MD   BP 115/74  Pulse 84  Temp(Src) 98.7 F (37.1 C) (Oral)   Resp 18  SpO2 96% Physical Exam  Constitutional: She is oriented to person, place, and time. She appears well-developed and well-nourished. No distress.  HENT:  Pharyngeal cobblestoning. Boggy nasal turbinates. Mild L Maxillary sinus ttp. Mild bilat cervical lymphadenopathy  Eyes: EOM are normal. Pupils are equal, round, and reactive to light.  Cardiovascular: Normal rate, regular rhythm, normal heart sounds and intact distal pulses.   No murmur heard. Pulmonary/Chest: Effort normal and breath sounds normal. No respiratory distress. She has no wheezes. She has no rales. She exhibits no tenderness.  Abdominal: Soft.  Musculoskeletal: Normal range of motion. She exhibits no edema and no tenderness.  Neurological: She is alert and oriented to person, place, and time.  Skin: Skin is warm and dry. She is not diaphoretic.  Psychiatric: She has a normal mood and affect. Her behavior is normal. Judgment and thought content normal.    ED Course  Procedures (including critical care time) Labs Review Labs Reviewed  POCT RAPID STREP A ([place])    Imaging Review No results found.   MDM   1. Viral URI with cough    Viral URI w/ cough. Strep test neg. Strep Cx sent toradol 30mg  given in office.  Rest, fluids, robitussin AC, nasal atrovent Precautions given and all questions answered  Linna Darner, MD Family Medicine  10/07/2013, 9:35 AM     Waldemar Dickens, MD 10/07/13 [phone]

## 2013-10-09 LAB — CULTURE, GROUP A STREP

## 2013-10-12 NOTE — ED Notes (Addendum)
Patient called, c/o she is not any better, and in fact feels worse, w green secretions, throat sore, hard to swallow. Spoke w Dr Barbaraann Faster, who authorized azithromycin 250 mg, quant #6 . Tab 2 on day #1, then on remaining days , take, take #1 tablet . Called in to [facility] at patient request, left detailed message on pharmacy answering machine

## 2013-11-30 ENCOUNTER — Other Ambulatory Visit: Payer: Self-pay | Admitting: Family Medicine

## 2013-11-30 DIAGNOSIS — Z853 Personal history of malignant neoplasm of breast: Secondary | ICD-10-CM

## 2013-11-30 DIAGNOSIS — S2000XA Contusion of breast, unspecified breast, initial encounter: Secondary | ICD-10-CM

## 2013-11-30 DIAGNOSIS — Z9889 Other specified postprocedural states: Secondary | ICD-10-CM

## 2013-11-30 DIAGNOSIS — N6489 Other specified disorders of breast: Secondary | ICD-10-CM

## 2013-12-14 ENCOUNTER — Ambulatory Visit
Admission: RE | Admit: 2013-12-14 | Discharge: 2013-12-14 | Disposition: A | Discharge status: Home / Self Care | Payer: Medicare Other | Source: Ambulatory Visit | Attending: Family Medicine | Admitting: Family Medicine

## 2013-12-14 ENCOUNTER — Other Ambulatory Visit: Payer: Self-pay | Admitting: Family Medicine

## 2013-12-14 DIAGNOSIS — Z9889 Other specified postprocedural states: Secondary | ICD-10-CM

## 2013-12-14 DIAGNOSIS — Z853 Personal history of malignant neoplasm of breast: Secondary | ICD-10-CM

## 2013-12-14 DIAGNOSIS — S2000XA Contusion of breast, unspecified breast, initial encounter: Secondary | ICD-10-CM

## 2013-12-14 DIAGNOSIS — N6489 Other specified disorders of breast: Secondary | ICD-10-CM

## 2013-12-30 ENCOUNTER — Other Ambulatory Visit: Payer: Self-pay | Admitting: Family Medicine

## 2013-12-30 DIAGNOSIS — N6310 Unspecified lump in the right breast, unspecified quadrant: Secondary | ICD-10-CM

## 2014-02-01 ENCOUNTER — Ambulatory Visit
Admission: RE | Admit: 2014-02-01 | Discharge: 2014-02-01 | Disposition: A | Discharge status: Home / Self Care | Payer: Medicare Other | Source: Ambulatory Visit | Attending: Family Medicine | Admitting: Family Medicine

## 2014-02-01 DIAGNOSIS — N6310 Unspecified lump in the right breast, unspecified quadrant: Secondary | ICD-10-CM

## 2014-05-12 ENCOUNTER — Other Ambulatory Visit: Payer: Self-pay | Admitting: Family Medicine

## 2014-05-12 DIAGNOSIS — N63 Unspecified lump in unspecified breast: Secondary | ICD-10-CM

## 2014-06-15 ENCOUNTER — Ambulatory Visit
Admission: RE | Admit: 2014-06-15 | Discharge: 2014-06-15 | Disposition: A | Discharge status: Home / Self Care | Payer: Medicare Other | Source: Ambulatory Visit | Attending: Family Medicine | Admitting: Family Medicine

## 2014-06-15 ENCOUNTER — Encounter (INDEPENDENT_AMBULATORY_CARE_PROVIDER_SITE_OTHER): Payer: Self-pay

## 2014-06-15 DIAGNOSIS — N63 Unspecified lump in unspecified breast: Secondary | ICD-10-CM

## 2014-07-13 ENCOUNTER — Other Ambulatory Visit: Payer: Self-pay | Admitting: Family Medicine

## 2014-07-13 DIAGNOSIS — N6310 Unspecified lump in the right breast, unspecified quadrant: Secondary | ICD-10-CM

## 2014-07-16 ENCOUNTER — Ambulatory Visit
Admission: RE | Admit: 2014-07-16 | Discharge: 2014-07-16 | Disposition: A | Discharge status: Home / Self Care | Payer: Medicare Other | Source: Ambulatory Visit | Attending: Family Medicine | Admitting: Family Medicine

## 2014-07-16 ENCOUNTER — Encounter (INDEPENDENT_AMBULATORY_CARE_PROVIDER_SITE_OTHER): Payer: Self-pay

## 2014-07-16 DIAGNOSIS — N6310 Unspecified lump in the right breast, unspecified quadrant: Secondary | ICD-10-CM

## 2014-11-30 ENCOUNTER — Other Ambulatory Visit: Payer: Self-pay | Admitting: Family Medicine

## 2014-11-30 DIAGNOSIS — M7989 Other specified soft tissue disorders: Secondary | ICD-10-CM

## 2014-11-30 DIAGNOSIS — Z803 Family history of malignant neoplasm of breast: Secondary | ICD-10-CM

## 2015-01-12 ENCOUNTER — Ambulatory Visit
Admission: RE | Admit: 2015-01-12 | Discharge: 2015-01-12 | Disposition: A | Discharge status: Home / Self Care | Payer: Medicare Other | Source: Ambulatory Visit | Attending: Family Medicine | Admitting: Family Medicine

## 2015-01-12 DIAGNOSIS — M7989 Other specified soft tissue disorders: Secondary | ICD-10-CM

## 2015-01-12 DIAGNOSIS — Z803 Family history of malignant neoplasm of breast: Secondary | ICD-10-CM

## 2015-01-19 ENCOUNTER — Encounter: Payer: Self-pay | Admitting: Gastroenterology

## 2015-05-31 DIAGNOSIS — M170 Bilateral primary osteoarthritis of knee: Secondary | ICD-10-CM | POA: Diagnosis not present

## 2015-05-31 DIAGNOSIS — E785 Hyperlipidemia, unspecified: Secondary | ICD-10-CM | POA: Diagnosis not present

## 2015-05-31 DIAGNOSIS — I872 Venous insufficiency (chronic) (peripheral): Secondary | ICD-10-CM | POA: Diagnosis not present

## 2015-05-31 DIAGNOSIS — I10 Essential (primary) hypertension: Secondary | ICD-10-CM | POA: Diagnosis not present

## 2015-05-31 DIAGNOSIS — E559 Vitamin D deficiency, unspecified: Secondary | ICD-10-CM | POA: Diagnosis not present

## 2015-10-12 DIAGNOSIS — H2513 Age-related nuclear cataract, bilateral: Secondary | ICD-10-CM | POA: Diagnosis not present

## 2015-10-12 DIAGNOSIS — H5203 Hypermetropia, bilateral: Secondary | ICD-10-CM | POA: Diagnosis not present

## 2015-11-30 ENCOUNTER — Emergency Department (HOSPITAL_COMMUNITY): Payer: PPO

## 2015-11-30 ENCOUNTER — Emergency Department (HOSPITAL_COMMUNITY)
Admission: EM | Admit: 2015-11-30 | Discharge: 2015-11-30 | Disposition: A | Discharge status: Home / Self Care | Payer: PPO | Attending: Emergency Medicine | Admitting: Emergency Medicine

## 2015-11-30 ENCOUNTER — Encounter (HOSPITAL_COMMUNITY): Payer: Self-pay | Admitting: Emergency Medicine

## 2015-11-30 DIAGNOSIS — W010XXA Fall on same level from slipping, tripping and stumbling without subsequent striking against object, initial encounter: Secondary | ICD-10-CM | POA: Diagnosis not present

## 2015-11-30 DIAGNOSIS — Y9389 Activity, other specified: Secondary | ICD-10-CM | POA: Insufficient documentation

## 2015-11-30 DIAGNOSIS — Y999 Unspecified external cause status: Secondary | ICD-10-CM | POA: Diagnosis not present

## 2015-11-30 DIAGNOSIS — Z7982 Long term (current) use of aspirin: Secondary | ICD-10-CM | POA: Insufficient documentation

## 2015-11-30 DIAGNOSIS — I10 Essential (primary) hypertension: Secondary | ICD-10-CM | POA: Diagnosis not present

## 2015-11-30 DIAGNOSIS — S42202A Unspecified fracture of upper end of left humerus, initial encounter for closed fracture: Secondary | ICD-10-CM | POA: Diagnosis not present

## 2015-11-30 DIAGNOSIS — S42292A Other displaced fracture of upper end of left humerus, initial encounter for closed fracture: Secondary | ICD-10-CM

## 2015-11-30 DIAGNOSIS — S80211A Abrasion, right knee, initial encounter: Secondary | ICD-10-CM | POA: Insufficient documentation

## 2015-11-30 DIAGNOSIS — Z79899 Other long term (current) drug therapy: Secondary | ICD-10-CM | POA: Insufficient documentation

## 2015-11-30 DIAGNOSIS — M25561 Pain in right knee: Secondary | ICD-10-CM | POA: Diagnosis not present

## 2015-11-30 DIAGNOSIS — Y929 Unspecified place or not applicable: Secondary | ICD-10-CM | POA: Diagnosis not present

## 2015-11-30 DIAGNOSIS — S42212A Unspecified displaced fracture of surgical neck of left humerus, initial encounter for closed fracture: Secondary | ICD-10-CM | POA: Diagnosis not present

## 2015-11-30 DIAGNOSIS — T148 Other injury of unspecified body region: Secondary | ICD-10-CM | POA: Diagnosis not present

## 2015-11-30 DIAGNOSIS — M79602 Pain in left arm: Secondary | ICD-10-CM | POA: Diagnosis not present

## 2015-11-30 DIAGNOSIS — S4992XA Unspecified injury of left shoulder and upper arm, initial encounter: Secondary | ICD-10-CM | POA: Diagnosis not present

## 2015-11-30 DIAGNOSIS — S59912A Unspecified injury of left forearm, initial encounter: Secondary | ICD-10-CM | POA: Diagnosis not present

## 2015-11-30 MED ORDER — OXYCODONE-ACETAMINOPHEN 5-325 MG PO TABS
1.0000 | ORAL_TABLET | Freq: Four times a day (QID) | ORAL | 0 refills | Status: DC | PRN
Start: 2015-11-30 — End: 2018-04-01

## 2015-11-30 MED ORDER — FENTANYL CITRATE (PF) 100 MCG/2ML IJ SOLN
50.0000 ug | Freq: Once | INTRAMUSCULAR | Status: AC
  Administered 2015-11-30: 50 ug via INTRAVENOUS
  Filled 2015-11-30: qty 2

## 2015-11-30 MED ORDER — OXYCODONE-ACETAMINOPHEN 5-325 MG PO TABS
1.0000 | ORAL_TABLET | Freq: Once | ORAL | Status: AC
  Administered 2015-11-30: 1 via ORAL
  Filled 2015-11-30: qty 1

## 2015-11-30 NOTE — ED Notes (Signed)
Patient transported to X-ray 

## 2015-11-30 NOTE — ED Provider Notes (Signed)
Medical screening examination/treatment/procedure(s) were conducted as a shared visit with non-physician practitioner(s) and myself.  I personally evaluated the patient during the encounter.   EKG Interpretation None      70 y.o. female presents with fall and humeral neck fx on left. Non-operative injury. No acute distress on exam. Routine ortho f/u in sling immobilization.   See related encounter note    Leo Grosser, MD 12/02/15 1126

## 2015-11-30 NOTE — ED Triage Notes (Signed)
Per pt, states patient tripped over curb and fell on left side-did not hear pop-swelling, no deformity

## 2015-11-30 NOTE — Discharge Instructions (Signed)
Read the information below.  You have a fracture of your humerus. It is very important that you remain in your splint until evaluated by Orthopedics.  You requested to see [facility], I have provided the contact information for [employer]. Please call ASAP for follow up.  You can apply ice for 20 minute increments to the affected area.  You can try motrin 400mg  every 6hrs for mild to moderate pain. I have prescribed percocet for severe pain.  Use the prescribed medication as directed.  Please discuss all new medications with your pharmacist.   You may return to the Emergency Department at any time for worsening condition or any new symptoms that concern you. Return to ED if you develop fever, numbness, change in color to your fingers, uncontrolled pain.

## 2015-12-02 ENCOUNTER — Other Ambulatory Visit: Payer: Self-pay | Admitting: Orthopedic Surgery

## 2015-12-02 ENCOUNTER — Ambulatory Visit
Admission: RE | Admit: 2015-12-02 | Discharge: 2015-12-02 | Disposition: A | Discharge status: Home / Self Care | Payer: PPO | Source: Ambulatory Visit | Attending: Orthopedic Surgery | Admitting: Orthopedic Surgery

## 2015-12-02 DIAGNOSIS — R937 Abnormal findings on diagnostic imaging of other parts of musculoskeletal system: Secondary | ICD-10-CM | POA: Diagnosis not present

## 2015-12-02 DIAGNOSIS — S42292A Other displaced fracture of upper end of left humerus, initial encounter for closed fracture: Secondary | ICD-10-CM | POA: Diagnosis not present

## 2015-12-02 DIAGNOSIS — S42142A Displaced fracture of glenoid cavity of scapula, left shoulder, initial encounter for closed fracture: Secondary | ICD-10-CM | POA: Diagnosis not present

## 2015-12-02 NOTE — ED Provider Notes (Signed)
[place] DEPT Provider Note   CSN: [id number] Arrival date & time: 11/30/15  1346     History   Chief Complaint Chief Complaint  Patient presents with  . Arm Pain    HPI Marie [name] is a 70 y.o. female.  Marie [name] is a 70 y.o. female with h/o HTN presents to ED s/p fall. Patient reports she was stepping up onto a curb when she tripped and landed on her left arm. She denies hitting her head or LOC. She is not on anti-coagulation therapy. She has associated left upper extremity pain, worse at shoulder and humerus. Movement makes the pain worse, rest minimizes the pain. She denies swelling, color change, warmth, fever, numbness, or weakness. She also complains of mild right knee pain. Denies chest pain, shortness of breath, abdominal pain, headache, dizziness, lightheadedness, changes in vision. No treatments tried PTA.      Past Medical History:  Diagnosis Date  . Fibrocystic breast   . Hypertension     There are no active problems to display for this patient.   Past Surgical History:  Procedure Laterality Date  . ABDOMINAL HYSTERECTOMY    . ANKLE SURGERY    . BREAST SURGERY    . TONSILLECTOMY    . VEIN SURGERY      OB History    No data available       Home Medications    Prior to Admission medications   Medication Sig Start Date End Date Taking? Authorizing Provider  Ascorbic Acid (VITAMIN C PO) Take 1 tablet by mouth daily as needed (immune health).   Yes Historical Provider, MD  aspirin 81 MG tablet Take 81 mg by mouth daily.   Yes Historical Provider, MD  Cholecalciferol (VITAMIN D) 2000 units CAPS Take 2,000 Units by mouth daily.   Yes Historical Provider, MD  hydrochlorothiazide (HYDRODIURIL) 50 MG tablet Take 25-50 mg by mouth daily. May take an additional 25mg s for shortness of breath/ fluid   Yes Historical Provider, MD  ibuprofen (ADVIL,MOTRIN) 200 MG tablet Take 800 mg by mouth every 6 (six) hours as needed for moderate pain.   Yes  Historical Provider, MD  Multiple Vitamin (MULTIVITAMIN WITH MINERALS) TABS tablet Take 1 tablet by mouth daily.   Yes Historical Provider, MD  potassium chloride (K-DUR) 10 MEQ tablet Take 10 mEq by mouth daily.   Yes Historical Provider, MD  Red Yeast Rice 600 MG TABS Take 600 mg by mouth daily.   Yes Historical Provider, MD  oxyCODONE-acetaminophen (PERCOCET/ROXICET) 5-325 MG tablet Take 1 tablet by mouth every 6 (six) hours as needed for severe pain. 11/30/15   Roxanna Mew, PA-C    Family History No family history on file.  Social History Social History  Substance Use Topics  . Smoking status: Never Smoker  . Smokeless tobacco: Never Used  . Alcohol use No     Allergies   Cholesterol; Hydrocodone; and Penicillins   Review of Systems Review of Systems  Constitutional: Negative for fever.  HENT: Negative for trouble swallowing.   Eyes: Negative for visual disturbance.  Respiratory: Negative for shortness of breath.   Cardiovascular: Negative for chest pain.  Gastrointestinal: Negative for abdominal pain, nausea and vomiting.  Genitourinary: Negative for dysuria and hematuria.  Musculoskeletal: Positive for arthralgias and myalgias. Negative for back pain and neck pain.  Skin: Negative for rash.  Neurological: Negative for dizziness, syncope, weakness, light-headedness, numbness and headaches.     Physical Exam Updated Vital Signs BP  135/86 (BP Location: Right Arm)   Pulse 70   Temp 98 F (36.7 C) (Oral)   Resp 16   SpO2 97%   Physical Exam  Constitutional: She appears well-developed and well-nourished. No distress.  HENT:  Head: Normocephalic and atraumatic.  Mouth/Throat: Oropharynx is clear and moist. No oropharyngeal exudate.  Eyes: Conjunctivae and EOM are normal. Pupils are equal, round, and reactive to light. Right eye exhibits no discharge. Left eye exhibits no discharge. No scleral icterus.  Neck: Normal range of motion and phonation normal. Neck  supple. No neck rigidity. Normal range of motion present.  Cardiovascular: Normal rate, regular rhythm, normal heart sounds and intact distal pulses.   No murmur heard. Pulmonary/Chest: Effort normal and breath sounds normal. No stridor. No respiratory distress. She has no wheezes. She has no rales.  Abdominal: Soft. Bowel sounds are normal. She exhibits no distension. There is no tenderness. There is no rigidity, no rebound, no guarding and no CVA tenderness.  Musculoskeletal: Normal range of motion.       Left shoulder: She exhibits tenderness, bony tenderness and pain.  Left upper extremity: TTP of entire extremity, greatest at proximal humerus and shoulder. Movement of forearm and wrist cause pain at proximal humerus. Patient able to move fingers. Sensation intact. 2+ radial pulses.  Right knee: superficial abrasion noted. Mild TTP. ROM, strength, and sensation intact. 2+ DP pulses.   Lymphadenopathy:    She has no cervical adenopathy.  Neurological: She is alert. She is not disoriented. Coordination and gait normal. GCS eye subscore is 4. GCS verbal subscore is 5. GCS motor subscore is 6.  Skin: Skin is warm and dry. She is not diaphoretic.  Psychiatric: She has a normal mood and affect. Her behavior is normal.     ED Treatments / Results  Labs (all labs ordered are listed, but only abnormal results are displayed) Labs Reviewed - No data to display  EKG  EKG Interpretation None       Radiology Dg Forearm Left  Result Date: 11/30/2015 CLINICAL DATA:  Fall. EXAM: LEFT FOREARM - 2 VIEW COMPARISON:  No recent prior. FINDINGS: No acute bony or joint abnormality identified. No evidence of fracture or dislocation. IMPRESSION: No acute abnormality. Electronically Signed   By: Marcello Moores  Register   On: 11/30/2015 16:01   Dg Shoulder Left  Result Date: 11/30/2015 CLINICAL DATA:  Tripped and fell today.  Injured left shoulder. EXAM: LEFT SHOULDER - 2+ VIEW COMPARISON:  None. FINDINGS:  There is a complex comminuted impacted fracture of the humeral head and neck. CT suggested for further evaluation and preoperative planning. The [facility] joint is intact. No clavicle fracture. The visualized left ribs are intact. IMPRESSION: Comminuted and impacted left humeral head and neck fracture. Electronically Signed   By: Marijo Sanes M.D.   On: 11/30/2015 15:57   Dg Knee Complete 4 Views Right  Result Date: 11/30/2015 CLINICAL DATA:  Tripped and fell today.  Right knee pain. EXAM: RIGHT KNEE - COMPLETE 4+ VIEW COMPARISON:  None. FINDINGS: Significant tricompartmental degenerative changes with joint space narrowing and osteophytic spurring. No acute fracture or osteochondral abnormality. The quadriceps and patellar tendons appear intact. No large joint effusion. IMPRESSION: Tricompartmental degenerative changes but no acute fracture. Electronically Signed   By: Marijo Sanes M.D.   On: 11/30/2015 16:00   Dg Humerus Left  Result Date: 11/30/2015 CLINICAL DATA:  [place] today and injured left arm. EXAM: LEFT HUMERUS - 2+ VIEW COMPARISON:  None. FINDINGS: Comminuted and  impacted humeral head and neck fractures. No humeral shaft fracture. The visualized elbow joint is intact. IMPRESSION: Comminuted and impacted humeral head and neck fractures. No humeral shaft fracture. Electronically Signed   By: Marijo Sanes M.D.   On: 11/30/2015 16:01    Procedures Procedures (including critical care time)  Medications Ordered in ED Medications  fentaNYL (SUBLIMAZE) injection 50 mcg (50 mcg Intravenous Given 11/30/15 1514)  oxyCODONE-acetaminophen (PERCOCET/ROXICET) 5-325 MG per tablet 1 tablet (1 tablet Oral Given 11/30/15 1605)     Initial Impression / Assessment and Plan / ED Course  I have reviewed the triage vital signs and the nursing notes.  Pertinent labs & imaging results that were available during my care of the patient were reviewed by me and considered in my medical decision making (see chart for  details).  Clinical Course  Comment By Time  X-rays reviewed - fracture of left humeral head and neck. No other obvious fractures or dislocations.  Roxanna Mew, [place] 09/27 1610    Patient presents to ED with complaint of left upper extremity pain s/p fall. Patient is afebrile and non-toxic appearing in NAD. VSS. Physical exam remarkable for exquisite tenderness of left upper extremity, worse at shoulder and proximal humerus. Able to move fingers, sensation intact, and 2+ radial pulses. Mild TTP of right knee with superficial abrasion, neurovascularly intact. No head trauma or LOC. Suspect mechanical fall. Pain managed in ED. Will x-ray left upper extremity and right knee.   X-ray remarkable for comminuted and impacted left humeral head and neck fracture. Discussed results and plan with pt. Pt placed in shoulder immobilizer. Follow up with orthopedics ASAP, pt reports she has an orthopedic provider, will call. Symptomatic management discussed. Rx vicodin. Review of  controlled substance database shows no recent rx for narcotic. Return precautions discussed. Pt voiced understanding and is agreeable.    Final Clinical Impressions(s) / ED Diagnoses   Final diagnoses:  Humeral head fracture, left, closed, initial encounter    New Prescriptions Discharge Medication List as of 11/30/2015  4:18 PM       Roxanna Mew, PA-C 12/02/15 [id number]    Leo Grosser, MD 12/02/15 1126

## 2015-12-05 DIAGNOSIS — S42292D Other displaced fracture of upper end of left humerus, subsequent encounter for fracture with routine healing: Secondary | ICD-10-CM | POA: Diagnosis not present

## 2015-12-05 DIAGNOSIS — S42142D Displaced fracture of glenoid cavity of scapula, left shoulder, subsequent encounter for fracture with routine healing: Secondary | ICD-10-CM | POA: Diagnosis not present

## 2015-12-16 DIAGNOSIS — S42292D Other displaced fracture of upper end of left humerus, subsequent encounter for fracture with routine healing: Secondary | ICD-10-CM | POA: Diagnosis not present

## 2016-01-02 DIAGNOSIS — S42142D Displaced fracture of glenoid cavity of scapula, left shoulder, subsequent encounter for fracture with routine healing: Secondary | ICD-10-CM | POA: Diagnosis not present

## 2016-01-02 DIAGNOSIS — S42292D Other displaced fracture of upper end of left humerus, subsequent encounter for fracture with routine healing: Secondary | ICD-10-CM | POA: Diagnosis not present

## 2016-01-03 ENCOUNTER — Other Ambulatory Visit: Payer: Self-pay | Admitting: Family Medicine

## 2016-01-03 DIAGNOSIS — Z1231 Encounter for screening mammogram for malignant neoplasm of breast: Secondary | ICD-10-CM

## 2016-01-18 DIAGNOSIS — E785 Hyperlipidemia, unspecified: Secondary | ICD-10-CM | POA: Diagnosis not present

## 2016-01-18 DIAGNOSIS — S42295D Other nondisplaced fracture of upper end of left humerus, subsequent encounter for fracture with routine healing: Secondary | ICD-10-CM | POA: Diagnosis not present

## 2016-01-18 DIAGNOSIS — E559 Vitamin D deficiency, unspecified: Secondary | ICD-10-CM | POA: Diagnosis not present

## 2016-01-18 DIAGNOSIS — I10 Essential (primary) hypertension: Secondary | ICD-10-CM | POA: Diagnosis not present

## 2016-01-18 DIAGNOSIS — Z23 Encounter for immunization: Secondary | ICD-10-CM | POA: Diagnosis not present

## 2016-02-01 DIAGNOSIS — S42292D Other displaced fracture of upper end of left humerus, subsequent encounter for fracture with routine healing: Secondary | ICD-10-CM | POA: Diagnosis not present

## 2016-02-10 DIAGNOSIS — S42292D Other displaced fracture of upper end of left humerus, subsequent encounter for fracture with routine healing: Secondary | ICD-10-CM | POA: Diagnosis not present

## 2016-02-14 DIAGNOSIS — S42292D Other displaced fracture of upper end of left humerus, subsequent encounter for fracture with routine healing: Secondary | ICD-10-CM | POA: Diagnosis not present

## 2016-02-16 DIAGNOSIS — S42292D Other displaced fracture of upper end of left humerus, subsequent encounter for fracture with routine healing: Secondary | ICD-10-CM | POA: Diagnosis not present

## 2016-02-21 DIAGNOSIS — S42292D Other displaced fracture of upper end of left humerus, subsequent encounter for fracture with routine healing: Secondary | ICD-10-CM | POA: Diagnosis not present

## 2016-02-28 DIAGNOSIS — S42292D Other displaced fracture of upper end of left humerus, subsequent encounter for fracture with routine healing: Secondary | ICD-10-CM | POA: Diagnosis not present

## 2016-03-01 ENCOUNTER — Ambulatory Visit
Admission: RE | Admit: 2016-03-01 | Discharge: 2016-03-01 | Disposition: A | Discharge status: Home / Self Care | Payer: PPO | Source: Ambulatory Visit | Attending: Family Medicine | Admitting: Family Medicine

## 2016-03-01 DIAGNOSIS — Z1231 Encounter for screening mammogram for malignant neoplasm of breast: Secondary | ICD-10-CM | POA: Diagnosis not present

## 2016-03-01 DIAGNOSIS — S42292D Other displaced fracture of upper end of left humerus, subsequent encounter for fracture with routine healing: Secondary | ICD-10-CM | POA: Diagnosis not present

## 2016-03-05 DIAGNOSIS — J069 Acute upper respiratory infection, unspecified: Secondary | ICD-10-CM | POA: Diagnosis not present

## 2016-03-05 DIAGNOSIS — R05 Cough: Secondary | ICD-10-CM | POA: Diagnosis not present

## 2016-03-08 DIAGNOSIS — S42292D Other displaced fracture of upper end of left humerus, subsequent encounter for fracture with routine healing: Secondary | ICD-10-CM | POA: Diagnosis not present

## 2016-03-12 DIAGNOSIS — S42292D Other displaced fracture of upper end of left humerus, subsequent encounter for fracture with routine healing: Secondary | ICD-10-CM | POA: Diagnosis not present

## 2016-03-15 DIAGNOSIS — S42292D Other displaced fracture of upper end of left humerus, subsequent encounter for fracture with routine healing: Secondary | ICD-10-CM | POA: Diagnosis not present

## 2016-03-20 DIAGNOSIS — S42292D Other displaced fracture of upper end of left humerus, subsequent encounter for fracture with routine healing: Secondary | ICD-10-CM | POA: Diagnosis not present

## 2016-03-27 DIAGNOSIS — S42292D Other displaced fracture of upper end of left humerus, subsequent encounter for fracture with routine healing: Secondary | ICD-10-CM | POA: Diagnosis not present

## 2016-03-29 DIAGNOSIS — S42292D Other displaced fracture of upper end of left humerus, subsequent encounter for fracture with routine healing: Secondary | ICD-10-CM | POA: Diagnosis not present

## 2016-04-03 DIAGNOSIS — S42292D Other displaced fracture of upper end of left humerus, subsequent encounter for fracture with routine healing: Secondary | ICD-10-CM | POA: Diagnosis not present

## 2016-04-04 DIAGNOSIS — S42142D Displaced fracture of glenoid cavity of scapula, left shoulder, subsequent encounter for fracture with routine healing: Secondary | ICD-10-CM | POA: Diagnosis not present

## 2016-04-04 DIAGNOSIS — S42152D Displaced fracture of neck of scapula, left shoulder, subsequent encounter for fracture with routine healing: Secondary | ICD-10-CM | POA: Diagnosis not present

## 2016-04-04 DIAGNOSIS — S42292D Other displaced fracture of upper end of left humerus, subsequent encounter for fracture with routine healing: Secondary | ICD-10-CM | POA: Diagnosis not present

## 2016-04-05 DIAGNOSIS — S42292D Other displaced fracture of upper end of left humerus, subsequent encounter for fracture with routine healing: Secondary | ICD-10-CM | POA: Diagnosis not present

## 2016-04-09 DIAGNOSIS — S42292D Other displaced fracture of upper end of left humerus, subsequent encounter for fracture with routine healing: Secondary | ICD-10-CM | POA: Diagnosis not present

## 2016-04-12 DIAGNOSIS — S42292D Other displaced fracture of upper end of left humerus, subsequent encounter for fracture with routine healing: Secondary | ICD-10-CM | POA: Diagnosis not present

## 2016-04-17 DIAGNOSIS — S42292D Other displaced fracture of upper end of left humerus, subsequent encounter for fracture with routine healing: Secondary | ICD-10-CM | POA: Diagnosis not present

## 2016-04-19 DIAGNOSIS — S42292D Other displaced fracture of upper end of left humerus, subsequent encounter for fracture with routine healing: Secondary | ICD-10-CM | POA: Diagnosis not present

## 2016-04-24 DIAGNOSIS — S42292D Other displaced fracture of upper end of left humerus, subsequent encounter for fracture with routine healing: Secondary | ICD-10-CM | POA: Diagnosis not present

## 2016-04-26 DIAGNOSIS — S42292D Other displaced fracture of upper end of left humerus, subsequent encounter for fracture with routine healing: Secondary | ICD-10-CM | POA: Diagnosis not present

## 2016-07-17 DIAGNOSIS — I872 Venous insufficiency (chronic) (peripheral): Secondary | ICD-10-CM | POA: Diagnosis not present

## 2016-07-17 DIAGNOSIS — I10 Essential (primary) hypertension: Secondary | ICD-10-CM | POA: Diagnosis not present

## 2016-07-17 DIAGNOSIS — E7800 Pure hypercholesterolemia, unspecified: Secondary | ICD-10-CM | POA: Diagnosis not present

## 2016-07-26 ENCOUNTER — Other Ambulatory Visit: Payer: Self-pay | Admitting: *Deleted

## 2016-07-26 DIAGNOSIS — I83893 Varicose veins of bilateral lower extremities with other complications: Secondary | ICD-10-CM

## 2016-09-03 ENCOUNTER — Encounter: Payer: Self-pay | Admitting: Vascular Surgery

## 2016-09-07 ENCOUNTER — Encounter: Payer: Self-pay | Admitting: Vascular Surgery

## 2016-09-17 ENCOUNTER — Ambulatory Visit (HOSPITAL_COMMUNITY)
Admission: RE | Admit: 2016-09-17 | Discharge: 2016-09-17 | Disposition: A | Discharge status: Home / Self Care | Payer: PPO | Source: Ambulatory Visit | Attending: Vascular Surgery | Admitting: Vascular Surgery

## 2016-09-17 ENCOUNTER — Encounter: Payer: PPO | Admitting: Vascular Surgery

## 2016-09-17 DIAGNOSIS — I83893 Varicose veins of bilateral lower extremities with other complications: Secondary | ICD-10-CM | POA: Insufficient documentation

## 2016-09-19 ENCOUNTER — Encounter: Payer: Self-pay | Admitting: Vascular Surgery

## 2016-09-19 ENCOUNTER — Ambulatory Visit (INDEPENDENT_AMBULATORY_CARE_PROVIDER_SITE_OTHER): Payer: PPO | Admitting: Vascular Surgery

## 2016-09-19 VITALS — BP 123/75 | HR 87 | Temp 97.8°F | Resp 16 | Ht 66.0 in | Wt 220.0 lb

## 2016-09-19 DIAGNOSIS — I83891 Varicose veins of right lower extremities with other complications: Secondary | ICD-10-CM | POA: Insufficient documentation

## 2016-09-19 DIAGNOSIS — I83893 Varicose veins of bilateral lower extremities with other complications: Secondary | ICD-10-CM

## 2016-09-19 NOTE — Progress Notes (Signed)
Subjective:     Patient ID: Marie [name], female   DOB: Jul 22, 1945, 71 y.o.   MRN: [id number]  HPI This 71 year old female had bilateral great saphenous vein strippings by Dr. Linus Mako 30 years ago. She did develop recurrent varicosities after several years and over the past 5 years has developed significant progression of recurrent varicose veins in both thighs and calves as well as progressive edema in both ankles. She does not wear elastic compression stockings on a regular basis nor elevate her legs. She has no history of DVT thrombophlebitis stasis ulcers or bleeding. She develops aching throbbing burning discomfort and progressive swelling as the day progresses. This is affecting her daily living.  Past Medical History:  Diagnosis Date  . Fibrocystic breast   . Hypertension     Social History  Substance Use Topics  . Smoking status: Never Smoker  . Smokeless tobacco: Never Used  . Alcohol use No    History reviewed. No pertinent family history.  Allergies  Allergen Reactions  . Cholesterol     Muscle pains  . Hydrocodone     Unsure if true allergy as developed rash after a surgery years ago and had been given hydrocodone  . Latex Rash  . Penicillins Rash    Has patient had a PCN reaction causing immediate rash, facial/tongue/throat swelling, SOB or lightheadedness with hypotension: Yes Has patient had a PCN reaction causing severe rash involving mucus membranes or skin necrosis: No Has patient had a PCN reaction that required hospitalization No Has patient had a PCN reaction occurring within the last 10 years: No If all of the above answers are "NO", then may proceed with Cephalosporin use.      Current Outpatient Prescriptions:  .  Ascorbic Acid (VITAMIN C PO), Take 1 tablet by mouth daily as needed (immune health)., Disp: , Rfl:  .  aspirin 81 MG tablet, Take 81 mg by mouth daily., Disp: , Rfl:  .  Cholecalciferol (VITAMIN D) 2000 units CAPS, Take 2,000 Units  by mouth daily., Disp: , Rfl:  .  hydrochlorothiazide (HYDRODIURIL) 50 MG tablet, Take 25-50 mg by mouth daily. May take an additional 25mg s for shortness of breath/ fluid, Disp: , Rfl:  .  ibuprofen (ADVIL,MOTRIN) 200 MG tablet, Take 800 mg by mouth every 6 (six) hours as needed for moderate pain., Disp: , Rfl:  .  Multiple Vitamin (MULTIVITAMIN WITH MINERALS) TABS tablet, Take 1 tablet by mouth daily., Disp: , Rfl:  .  oxyCODONE-acetaminophen (PERCOCET/ROXICET) 5-325 MG tablet, Take 1 tablet by mouth every 6 (six) hours as needed for severe pain., Disp: 12 tablet, Rfl: 0 .  potassium chloride (K-DUR) 10 MEQ tablet, Take 10 mEq by mouth daily., Disp: , Rfl:  .  Red Yeast Rice 600 MG TABS, Take 600 mg by mouth daily., Disp: , Rfl:   Vitals:   09/19/16 1346  BP: 123/75  Pulse: 87  Resp: 16  Temp: 97.8 F (36.6 C)  SpO2: 96%  Weight: 220 lb (99.8 kg)  Height: 5\' 6"  (1.676 m)    Body mass index is 35.51 kg/m.         Review of Systems Denies chest pain, dyspnea on exertion, PND, orthopnea, hemoptysis, claudication.    Objective:   Physical Exam BP 123/75   Pulse 87   Temp 97.8 F (36.6 C)   Resp 16   Ht 5\' 6"  (1.676 m)   Wt 220 lb (99.8 kg)   SpO2 96%   BMI 35.51  kg/m     Gen.-alert and oriented x3 in no apparent distress HEENT normal for age Lungs no rhonchi or wheezing Cardiovascular regular rhythm no murmurs carotid pulses 3+ palpable no bruits audible Abdomen soft nontender no palpable masses Musculoskeletal free of  major deformities Skin clear -no rashes Neurologic normal Lower extremities 3+ femoral and dorsalis pedis pulses palpable bilaterally with 1+ edema bilaterally Bulging varicosities in both medial thighs medial calf and posterior calf areas with early hyperpigmentation distally but no active ulceration.  Today I ordered a venous duplex exam of both legs which I reviewed and interpreted and also performed a bedside SonoSite ultrasound exam for  confirmation. Bilateral great saphenous veins are not identified consistent with previous vein stripping Bilateral small saphenous veins are of very large caliber with gross reflux throughout supplying many of these painful varicosities       Assessment:     #1 bilateral recurrent varicosities-30 years status post bilateral great saphenous vein stripping currently with gross reflux in large caliber small saphenous veins bilaterally causing symptoms which are affecting patient's daily living    Plan:         #1 long leg elastic compression stockings 20-30 mm gradient #2 elevate legs as much as possible #3 ibuprofen daily on a regular basis for pain #4 return in 3 months-if no significant improvement then she will need #1 laser ablation left small saphenous vein followed by #2 laser ablation right small saphenous vein She will then return in 3 months to be evaluated for stab phlebectomy of her multiple bilateral painful varicosities Return in 3 months

## 2016-12-25 ENCOUNTER — Ambulatory Visit: Payer: PPO | Admitting: Vascular Surgery

## 2017-01-18 ENCOUNTER — Other Ambulatory Visit: Payer: Self-pay | Admitting: Family Medicine

## 2017-01-18 DIAGNOSIS — Z1231 Encounter for screening mammogram for malignant neoplasm of breast: Secondary | ICD-10-CM

## 2017-01-22 ENCOUNTER — Encounter: Payer: Self-pay | Admitting: Vascular Surgery

## 2017-01-22 ENCOUNTER — Ambulatory Visit: Payer: PPO | Admitting: Vascular Surgery

## 2017-01-22 VITALS — BP 133/78 | HR 67 | Temp 97.1°F | Resp 18 | Ht 66.0 in | Wt 216.9 lb

## 2017-01-22 DIAGNOSIS — I83893 Varicose veins of bilateral lower extremities with other complications: Secondary | ICD-10-CM

## 2017-01-22 DIAGNOSIS — E559 Vitamin D deficiency, unspecified: Secondary | ICD-10-CM | POA: Diagnosis not present

## 2017-01-22 DIAGNOSIS — Z23 Encounter for immunization: Secondary | ICD-10-CM | POA: Diagnosis not present

## 2017-01-22 DIAGNOSIS — E7800 Pure hypercholesterolemia, unspecified: Secondary | ICD-10-CM | POA: Diagnosis not present

## 2017-01-22 DIAGNOSIS — I10 Essential (primary) hypertension: Secondary | ICD-10-CM | POA: Diagnosis not present

## 2017-01-22 DIAGNOSIS — I872 Venous insufficiency (chronic) (peripheral): Secondary | ICD-10-CM | POA: Diagnosis not present

## 2017-01-22 NOTE — Progress Notes (Signed)
Subjective:     Patient ID: Marie [name], female   DOB: July 03, 1945, 71 y.o.   MRN: [id number]  HPI This 71 year old female returns for 3 month follow-up regarding her painful varicosities in both legs. She has remote history of bilateral great saphenous veins stripping by Dr. Victory Dakin 15+ years ago. She has recurrent varicosities with pain and swelling in the calf and was found to have severe reflux and bilateral enlarged small saphenous veins. She is tried long-leg elastic compression stockings 20-30 millimeter gradient as well as elevation and ibuprofen with no improvement in her symptoms which are affecting her daily living. She would like treatment. She has no history of DVT.  Past Medical History:  Diagnosis Date  . Fibrocystic breast   . Hypertension     Social History   Tobacco Use  . Smoking status: Never Smoker  . Smokeless tobacco: Never Used  Substance Use Topics  . Alcohol use: No    History reviewed. No pertinent family history.  Allergies  Allergen Reactions  . Cholesterol     Muscle pains  . Hydrocodone     Unsure if true allergy as developed rash after a surgery years ago and had been given hydrocodone  . Latex Rash  . Penicillins Rash    Has patient had a PCN reaction causing immediate rash, facial/tongue/throat swelling, SOB or lightheadedness with hypotension: Yes Has patient had a PCN reaction causing severe rash involving mucus membranes or skin necrosis: No Has patient had a PCN reaction that required hospitalization No Has patient had a PCN reaction occurring within the last 10 years: No If all of the above answers are "NO", then may proceed with Cephalosporin use.      Current Outpatient Medications:  .  Ascorbic Acid (VITAMIN C PO), Take 1 tablet by mouth daily as needed (immune health)., Disp: , Rfl:  .  aspirin 81 MG tablet, Take 81 mg by mouth daily., Disp: , Rfl:  .  Cholecalciferol (VITAMIN D) 2000 units CAPS, Take 2,000 Units by mouth  daily., Disp: , Rfl:  .  hydrochlorothiazide (HYDRODIURIL) 50 MG tablet, Take 25-50 mg by mouth daily. May take an additional 25mg s for shortness of breath/ fluid, Disp: , Rfl:  .  ibuprofen (ADVIL,MOTRIN) 200 MG tablet, Take 800 mg by mouth every 6 (six) hours as needed for moderate pain., Disp: , Rfl:  .  Multiple Vitamin (MULTIVITAMIN WITH MINERALS) TABS tablet, Take 1 tablet by mouth daily., Disp: , Rfl:  .  potassium chloride (K-DUR) 10 MEQ tablet, Take 10 mEq by mouth daily., Disp: , Rfl:  .  Red Yeast Rice 600 MG TABS, Take 600 mg by mouth daily., Disp: , Rfl:  .  oxyCODONE-acetaminophen (PERCOCET/ROXICET) 5-325 MG tablet, Take 1 tablet by mouth every 6 (six) hours as needed for severe pain. (Patient not taking: Reported on 01/22/2017), Disp: 12 tablet, Rfl: 0  Vitals:   01/22/17 1342  BP: 133/78  Pulse: 67  Resp: 18  Temp: (!) 97.1 F (36.2 C)  TempSrc: Oral  SpO2: 97%  Weight: 216 lb 14.4 oz (98.4 kg)  Height: 5\' 6"  (1.676 m)    Body mass index is 35.01 kg/m.         Review of Systems  Denies chest pain, dyspnea on exertion, PND, orthopnea, hemoptysis, claudication    Objective:   Physical Exam BP 133/78 (BP Location: Left Arm, Patient Position: Sitting, Cuff Size: Normal)   Pulse 67   Temp (!) 97.1 F (36.2 C) (  Oral)   Resp 18   Ht 5\' 6"  (1.676 m)   Wt 216 lb 14.4 oz (98.4 kg)   SpO2 97%   BMI 35.01 kg/m   Gen. well-developed well-nourished female no apparent distress alert and oriented 3 Lungs no rhonchi or wheezing Bilateral lower extremities with 1+ edema. Scattered varicosities bilateral medial thighs with no hyperpigmentation or ulceration noted  Today I reviewed the previous bilateral venous reflux exam performed in July 2018 and also performed a bedside SonoSite ultrasound exam. This reveals bilateral absent great saphenous veins in the thigh and inguinal regions with large caliber small saphenous veins bilaterally with gross reflux throughout      Assessment:     #1 painful varicosities and swelling bilaterally due to gross reflux and large bilateral small saphenous veins with history remotely of bilateral great saphenous vein stripping causing symptoms which are affecting patient's daily living and resistant to conservative measures including long-leg elastic compression stockings, elevation, and ibuprofen    Plan:     Patient needs #1 laser ablation right small saphenous vein followed by #2 laser ablation left small saphenous vein followed by a three-month waiting. Be evaluated for possible stab phlebectomy at that time We'll proceed with precertification to perform this in the near future

## 2017-02-21 ENCOUNTER — Other Ambulatory Visit: Payer: Self-pay | Admitting: *Deleted

## 2017-02-21 DIAGNOSIS — I83893 Varicose veins of bilateral lower extremities with other complications: Secondary | ICD-10-CM

## 2017-03-04 ENCOUNTER — Ambulatory Visit
Admission: RE | Admit: 2017-03-04 | Discharge: 2017-03-04 | Disposition: A | Discharge status: Home / Self Care | Payer: PPO | Source: Ambulatory Visit | Attending: Family Medicine | Admitting: Family Medicine

## 2017-03-04 DIAGNOSIS — Z1231 Encounter for screening mammogram for malignant neoplasm of breast: Secondary | ICD-10-CM | POA: Diagnosis not present

## 2017-03-18 ENCOUNTER — Other Ambulatory Visit: Payer: PPO | Admitting: Vascular Surgery

## 2017-03-25 ENCOUNTER — Ambulatory Visit: Payer: PPO | Admitting: Vascular Surgery

## 2017-03-25 ENCOUNTER — Encounter (HOSPITAL_COMMUNITY): Payer: PPO

## 2017-04-05 DIAGNOSIS — M25512 Pain in left shoulder: Secondary | ICD-10-CM | POA: Diagnosis not present

## 2017-04-05 DIAGNOSIS — M19012 Primary osteoarthritis, left shoulder: Secondary | ICD-10-CM | POA: Diagnosis not present

## 2017-04-13 DIAGNOSIS — M19012 Primary osteoarthritis, left shoulder: Secondary | ICD-10-CM | POA: Diagnosis not present

## 2017-05-01 DIAGNOSIS — M19012 Primary osteoarthritis, left shoulder: Secondary | ICD-10-CM | POA: Diagnosis not present

## 2017-05-01 DIAGNOSIS — M19019 Primary osteoarthritis, unspecified shoulder: Secondary | ICD-10-CM | POA: Diagnosis not present

## 2017-05-14 ENCOUNTER — Other Ambulatory Visit: Payer: Self-pay | Admitting: *Deleted

## 2017-05-14 DIAGNOSIS — I83893 Varicose veins of bilateral lower extremities with other complications: Secondary | ICD-10-CM

## 2017-05-19 ENCOUNTER — Encounter: Payer: Self-pay | Admitting: Gastroenterology

## 2017-06-13 DIAGNOSIS — J301 Allergic rhinitis due to pollen: Secondary | ICD-10-CM | POA: Diagnosis not present

## 2017-06-13 DIAGNOSIS — J209 Acute bronchitis, unspecified: Secondary | ICD-10-CM | POA: Diagnosis not present

## 2017-07-01 ENCOUNTER — Ambulatory Visit: Payer: PPO | Admitting: Vascular Surgery

## 2017-07-01 ENCOUNTER — Encounter: Payer: Self-pay | Admitting: Vascular Surgery

## 2017-07-01 VITALS — BP 133/76 | HR 77 | Temp 97.0°F | Resp 18 | Ht 66.0 in | Wt 207.0 lb

## 2017-07-01 DIAGNOSIS — I83891 Varicose veins of right lower extremities with other complications: Secondary | ICD-10-CM | POA: Diagnosis not present

## 2017-07-01 DIAGNOSIS — I868 Varicose veins of other specified sites: Secondary | ICD-10-CM

## 2017-07-01 HISTORY — PX: ENDOVENOUS ABLATION SAPHENOUS VEIN W/ LASER: SUR449

## 2017-07-01 NOTE — Progress Notes (Signed)
  Subjective:     Patient ID: Marie [name], female   DOB: 1945/03/11, 72 y.o.   MRN: [id number]  HPI This 72 year old female had laser ablation of the right small saphenous vein performed under local tumescent anesthesia.  A total of 1281 J of energy was utilized.  She tolerated the procedure well.  Review of Systems     Objective:   Physical Exam BP 133/76 (BP Location: Left Arm, Patient Position: Sitting, Cuff Size: Normal)   Pulse 77   Temp (!) 97 F (36.1 C) (Oral)   Resp 18   Ht 5\' 6"  (1.676 m)   Wt 207 lb (93.9 kg)   SpO2 97%   BMI 33.41 kg/m        Assessment:     Well-tolerated laser ablation right small saphenous vein performed under local tumescent anesthesia    Plan:     Return in 1 week for venous duplex exam to confirm closure right small saphenous vein She will then be scheduled for similar procedure and contralateral left leg

## 2017-07-01 NOTE — Progress Notes (Signed)
Laser Ablation Procedure    Date: 07/01/2017   Marie [name] DOB:14-Jan-1946  Consent signed: Yes    Surgeon:  Dr. Nelda Severe. Kellie Simmering  Procedure: Laser Ablation: right Small Saphenous Vein  BP 133/76 (BP Location: Left Arm, Patient Position: Sitting, Cuff Size: Normal)   Pulse 77   Temp (!) 97 F (36.1 C) (Oral)   Resp 18   Ht 5\' 6"  (1.676 m)   Wt 207 lb (93.9 kg)   SpO2 97%   BMI 33.41 kg/m   Tumescent Anesthesia: 325 cc 0.9% NaCl with 50 cc Lidocaine HCL 1% and 15 cc 8.4% NaHCO3  Local Anesthesia: 4 cc Lidocaine HCL and NaHCO3 (ratio 2:1)  Pulsed Mode: 15 watts, 517ms delay, 1.0 duration  Total Energy: 1281 Joules              Total Pulses: 86               Total Time: 1:25      Patient tolerated procedure well  Notes: latex allergy--- non-latex gloves used  Description of Procedure:  After marking the course of the secondary varicosities, the patient was placed on the operating table in the prone position, and the right leg was prepped and draped in sterile fashion.   Local anesthetic was administered and under ultrasound guidance the saphenous vein was accessed with a micro needle and guide wire; then the mirco puncture sheath was placed.  A guide wire was inserted saphenopopliteal junction , followed by a 5 french sheath.  The position of the sheath and then the laser fiber below the junction was confirmed using the ultrasound.  Tumescent anesthesia was administered along the course of the saphenous vein using ultrasound guidance. The patient was placed in Trendelenburg position and protective laser glasses were placed on patient and staff, and the laser was fired at 15 watts continuous mode advancing 1-25mm/second for a total of 1281 joules.      Steri strip was applied to the IV insertion site and ABD pads and thigh high compression stockings were applied.  Ace wrap bandages were applied over the right calf and thigh and at the top of the saphenopopliteal junction. Blood  loss was less than 15 cc.  The patient ambulated out of the operating room having tolerated the procedure well.

## 2017-07-03 ENCOUNTER — Encounter: Payer: Self-pay | Admitting: Vascular Surgery

## 2017-07-08 ENCOUNTER — Encounter: Payer: Self-pay | Admitting: Vascular Surgery

## 2017-07-08 ENCOUNTER — Ambulatory Visit (INDEPENDENT_AMBULATORY_CARE_PROVIDER_SITE_OTHER): Payer: PPO | Admitting: Vascular Surgery

## 2017-07-08 ENCOUNTER — Ambulatory Visit (HOSPITAL_COMMUNITY)
Admission: RE | Admit: 2017-07-08 | Discharge: 2017-07-08 | Disposition: A | Discharge status: Home / Self Care | Payer: PPO | Source: Ambulatory Visit | Attending: Vascular Surgery | Admitting: Vascular Surgery

## 2017-07-08 VITALS — BP 134/80 | HR 73 | Temp 97.8°F | Resp 18 | Ht 66.0 in | Wt 207.0 lb

## 2017-07-08 DIAGNOSIS — I83891 Varicose veins of right lower extremities with other complications: Secondary | ICD-10-CM | POA: Diagnosis not present

## 2017-07-08 DIAGNOSIS — I83893 Varicose veins of bilateral lower extremities with other complications: Secondary | ICD-10-CM | POA: Diagnosis not present

## 2017-07-08 NOTE — Progress Notes (Signed)
Subjective:     Patient ID: Marie [name], female   DOB: 1945/11/14, 72 y.o.   MRN: [id number]  HPI This 72 year old female returns for one-week follow-up regarding laser ablation of the right small saphenous vein.  She has a remote history of right great saphenous vein stripping by Dr. Glade Nurse.  He also has known gross reflux in the left small saphenous vein.  She states that she has had no significant swelling in the right ankle since her procedure.  The leg feels better.  She is wearing her elastic compression stocking as instructed and did take ibuprofen.  Past Medical History:  Diagnosis Date  . Fibrocystic breast   . Hypertension     Social History   Tobacco Use  . Smoking status: Never Smoker  . Smokeless tobacco: Never Used  Substance Use Topics  . Alcohol use: No    Family History  Problem Relation Age of Onset  . Breast cancer Sister 64  . Breast cancer Sister     Allergies  Allergen Reactions  . Cholesterol     Muscle pains  . Hydrocodone     Unsure if true allergy as developed rash after a surgery years ago and had been given hydrocodone  . Latex Rash  . Penicillins Rash    Has patient had a PCN reaction causing immediate rash, facial/tongue/throat swelling, SOB or lightheadedness with hypotension: Yes Has patient had a PCN reaction causing severe rash involving mucus membranes or skin necrosis: No Has patient had a PCN reaction that required hospitalization No Has patient had a PCN reaction occurring within the last 10 years: No If all of the above answers are "NO", then may proceed with Cephalosporin use.      Current Outpatient Medications:  .  Ascorbic Acid (VITAMIN C PO), Take 1 tablet by mouth daily as needed (immune health)., Disp: , Rfl:  .  aspirin 81 MG tablet, Take 81 mg by mouth daily., Disp: , Rfl:  .  Cholecalciferol (VITAMIN D) 2000 units CAPS, Take 2,000 Units by mouth daily., Disp: , Rfl:  .  hydrochlorothiazide (HYDRODIURIL) 50 MG  tablet, Take 25-50 mg by mouth daily. May take an additional 25mg s for shortness of breath/ fluid, Disp: , Rfl:  .  ibuprofen (ADVIL,MOTRIN) 200 MG tablet, Take 800 mg by mouth every 6 (six) hours as needed for moderate pain., Disp: , Rfl:  .  Multiple Vitamin (MULTIVITAMIN WITH MINERALS) TABS tablet, Take 1 tablet by mouth daily., Disp: , Rfl:  .  potassium chloride (K-DUR) 10 MEQ tablet, Take 10 mEq by mouth daily., Disp: , Rfl:  .  Red Yeast Rice 600 MG TABS, Take 600 mg by mouth daily., Disp: , Rfl:  .  oxyCODONE-acetaminophen (PERCOCET/ROXICET) 5-325 MG tablet, Take 1 tablet by mouth every 6 (six) hours as needed for severe pain. (Patient not taking: Reported on 01/22/2017), Disp: 12 tablet, Rfl: 0  Vitals:   07/08/17 1315  BP: 134/80  Pulse: 73  Resp: 18  Temp: 97.8 F (36.6 C)  TempSrc: Oral  SpO2: 97%  Weight: 207 lb (93.9 kg)  Height: 5\' 6"  (1.676 m)    Body mass index is 33.41 kg/m.         Review of Systems Chest pain, dyspnea on exertion, PND, orthopnea, hemoptysis, claudication    Objective:   Physical Exam BP 134/80 (BP Location: Left Arm, Patient Position: Sitting, Cuff Size: Normal)   Pulse 73   Temp 97.8 F (36.6 C) (Oral)  Resp 18   Ht 5\' 6"  (1.676 m)   Wt 207 lb (93.9 kg)   SpO2 97%   BMI 33.41 kg/m   General well-developed well-nourished female no apparent distress alert and oriented x3 Lungs no rhonchi or wheezing Right leg with mild discomfort to deep palpation in the mid calf distally.  No significant distal edema noted.  3+ dorsalis pedis pulse palpable.  Today ordered a venous duplex exam of the right leg which I reviewed and interpreted.  There is no DVT.  The right small saphenous vein is totally closed up to near the saphenofemoral Teel junction     Assessment:     Successful laser ablation right small saphenous vein for gross reflux with swelling with remote history of stripping of right great saphenous vein    Plan:     Patient  will return in the near future for similar procedure in contralateral left leg and this will complete her treatment regimen

## 2017-07-09 ENCOUNTER — Encounter (HOSPITAL_COMMUNITY): Payer: PPO

## 2017-07-09 ENCOUNTER — Ambulatory Visit: Payer: PPO | Admitting: Vascular Surgery

## 2017-07-10 ENCOUNTER — Telehealth: Payer: Self-pay | Admitting: Vascular Surgery

## 2017-07-10 ENCOUNTER — Other Ambulatory Visit: Payer: Self-pay | Admitting: *Deleted

## 2017-07-10 DIAGNOSIS — I83893 Varicose veins of bilateral lower extremities with other complications: Secondary | ICD-10-CM

## 2017-07-10 NOTE — Telephone Encounter (Signed)
-----   Message from Rica Records, RN sent at 07/10/2017  2:01 PM EDT ----- Regarding: scheduling Please schedule post LA duplex (left leg, order in Epic) and VV FU with Dr. Kellie Simmering on 06-03 or 08-06-2017.  Thanks!

## 2017-07-22 DIAGNOSIS — L309 Dermatitis, unspecified: Secondary | ICD-10-CM | POA: Diagnosis not present

## 2017-07-22 DIAGNOSIS — E559 Vitamin D deficiency, unspecified: Secondary | ICD-10-CM | POA: Diagnosis not present

## 2017-07-22 DIAGNOSIS — Z23 Encounter for immunization: Secondary | ICD-10-CM | POA: Diagnosis not present

## 2017-07-22 DIAGNOSIS — Z0000 Encounter for general adult medical examination without abnormal findings: Secondary | ICD-10-CM | POA: Diagnosis not present

## 2017-07-22 DIAGNOSIS — E7800 Pure hypercholesterolemia, unspecified: Secondary | ICD-10-CM | POA: Diagnosis not present

## 2017-07-22 DIAGNOSIS — I872 Venous insufficiency (chronic) (peripheral): Secondary | ICD-10-CM | POA: Diagnosis not present

## 2017-07-22 DIAGNOSIS — I10 Essential (primary) hypertension: Secondary | ICD-10-CM | POA: Diagnosis not present

## 2017-07-22 DIAGNOSIS — H7292 Unspecified perforation of tympanic membrane, left ear: Secondary | ICD-10-CM | POA: Diagnosis not present

## 2017-07-22 DIAGNOSIS — M810 Age-related osteoporosis without current pathological fracture: Secondary | ICD-10-CM | POA: Diagnosis not present

## 2017-07-30 ENCOUNTER — Other Ambulatory Visit: Payer: PPO | Admitting: Vascular Surgery

## 2017-08-05 ENCOUNTER — Encounter (HOSPITAL_COMMUNITY): Payer: PPO

## 2017-08-05 ENCOUNTER — Ambulatory Visit: Payer: PPO | Admitting: Vascular Surgery

## 2017-08-08 DIAGNOSIS — H6612 Chronic tubotympanic suppurative otitis media, left ear: Secondary | ICD-10-CM | POA: Diagnosis not present

## 2017-08-08 DIAGNOSIS — H7292 Unspecified perforation of tympanic membrane, left ear: Secondary | ICD-10-CM | POA: Diagnosis not present

## 2017-08-08 DIAGNOSIS — H90A32 Mixed conductive and sensorineural hearing loss, unilateral, left ear with restricted hearing on the contralateral side: Secondary | ICD-10-CM | POA: Diagnosis not present

## 2017-08-08 DIAGNOSIS — H90A31 Mixed conductive and sensorineural hearing loss, unilateral, right ear with restricted hearing on the contralateral side: Secondary | ICD-10-CM | POA: Diagnosis not present

## 2017-08-08 DIAGNOSIS — H9190 Unspecified hearing loss, unspecified ear: Secondary | ICD-10-CM | POA: Diagnosis not present

## 2017-08-08 DIAGNOSIS — H90A12 Conductive hearing loss, unilateral, left ear with restricted hearing on the contralateral side: Secondary | ICD-10-CM | POA: Diagnosis not present

## 2017-09-02 DIAGNOSIS — M8588 Other specified disorders of bone density and structure, other site: Secondary | ICD-10-CM | POA: Diagnosis not present

## 2017-09-02 DIAGNOSIS — M810 Age-related osteoporosis without current pathological fracture: Secondary | ICD-10-CM | POA: Diagnosis not present

## 2018-01-16 ENCOUNTER — Other Ambulatory Visit: Payer: Self-pay | Admitting: Family Medicine

## 2018-01-16 DIAGNOSIS — Z1231 Encounter for screening mammogram for malignant neoplasm of breast: Secondary | ICD-10-CM

## 2018-01-27 DIAGNOSIS — Z1211 Encounter for screening for malignant neoplasm of colon: Secondary | ICD-10-CM | POA: Diagnosis not present

## 2018-01-27 DIAGNOSIS — E559 Vitamin D deficiency, unspecified: Secondary | ICD-10-CM | POA: Diagnosis not present

## 2018-01-27 DIAGNOSIS — I10 Essential (primary) hypertension: Secondary | ICD-10-CM | POA: Diagnosis not present

## 2018-01-27 DIAGNOSIS — E7800 Pure hypercholesterolemia, unspecified: Secondary | ICD-10-CM | POA: Diagnosis not present

## 2018-01-27 DIAGNOSIS — I739 Peripheral vascular disease, unspecified: Secondary | ICD-10-CM | POA: Diagnosis not present

## 2018-01-27 DIAGNOSIS — Z23 Encounter for immunization: Secondary | ICD-10-CM | POA: Diagnosis not present

## 2018-02-11 ENCOUNTER — Encounter: Payer: Self-pay | Admitting: Gastroenterology

## 2018-03-06 ENCOUNTER — Ambulatory Visit
Admission: RE | Admit: 2018-03-06 | Discharge: 2018-03-06 | Disposition: A | Discharge status: Home / Self Care | Payer: PPO | Source: Ambulatory Visit | Attending: Family Medicine | Admitting: Family Medicine

## 2018-03-06 DIAGNOSIS — Z1231 Encounter for screening mammogram for malignant neoplasm of breast: Secondary | ICD-10-CM | POA: Diagnosis not present

## 2018-03-18 ENCOUNTER — Ambulatory Visit (AMBULATORY_SURGERY_CENTER): Payer: Self-pay | Admitting: *Deleted

## 2018-03-18 ENCOUNTER — Encounter: Payer: Self-pay | Admitting: Gastroenterology

## 2018-03-18 VITALS — Ht 64.0 in | Wt 214.0 lb

## 2018-03-18 DIAGNOSIS — Z800 Family history of malignant neoplasm of digestive organs: Secondary | ICD-10-CM

## 2018-03-18 NOTE — Progress Notes (Signed)
No egg or soy allergy known to patient   issues with past sedation with any surgeries  or procedures of PONV , no intubation problems  No diet pills per patient No home 02 use per patient  No blood thinners per patient  Pt denies issues with constipation  No A fib or A flutter  EMMI video sent to pt's e mail - pt declined   

## 2018-04-01 ENCOUNTER — Encounter: Payer: Self-pay | Admitting: Gastroenterology

## 2018-04-01 ENCOUNTER — Ambulatory Visit (AMBULATORY_SURGERY_CENTER): Payer: PPO | Admitting: Gastroenterology

## 2018-04-01 VITALS — BP 133/67 | HR 66 | Temp 97.7°F | Resp 9 | Ht 64.0 in | Wt 214.0 lb

## 2018-04-01 DIAGNOSIS — Z1211 Encounter for screening for malignant neoplasm of colon: Secondary | ICD-10-CM | POA: Diagnosis not present

## 2018-04-01 DIAGNOSIS — D120 Benign neoplasm of cecum: Secondary | ICD-10-CM

## 2018-04-01 DIAGNOSIS — D122 Benign neoplasm of ascending colon: Secondary | ICD-10-CM

## 2018-04-01 DIAGNOSIS — Z800 Family history of malignant neoplasm of digestive organs: Secondary | ICD-10-CM

## 2018-04-01 DIAGNOSIS — K635 Polyp of colon: Secondary | ICD-10-CM | POA: Diagnosis not present

## 2018-04-01 MED ORDER — SODIUM CHLORIDE 0.9 % IV SOLN: 500.0000 mL | Freq: Once | INTRAVENOUS | Status: DC

## 2018-04-01 NOTE — Progress Notes (Signed)
Pt's states no medical or surgical changes since previsit or office visit. 

## 2018-04-01 NOTE — Patient Instructions (Signed)
Information on polyps and hemorrhoids given to you today.  Await pathology results.   YOU HAD AN ENDOSCOPIC PROCEDURE TODAY AT THE [facility]:   Refer to the procedure report that was given to you for any specific questions about what was found during the examination.  If the procedure report does not answer your questions, please call your gastroenterologist to clarify.  If you requested that your care partner not be given the details of your procedure findings, then the procedure report has been included in a sealed envelope for you to review at your convenience later.  YOU SHOULD EXPECT: Some feelings of bloating in the abdomen. Passage of more gas than usual.  Walking can help get rid of the air that was put into your GI tract during the procedure and reduce the bloating. If you had a lower endoscopy (such as a colonoscopy or flexible sigmoidoscopy) you may notice spotting of blood in your stool or on the toilet paper. If you underwent a bowel prep for your procedure, you may not have a normal bowel movement for a few days.  Please Note:  You might notice some irritation and congestion in your nose or some drainage.  This is from the oxygen used during your procedure.  There is no need for concern and it should clear up in a day or so.  SYMPTOMS TO REPORT IMMEDIATELY:   Following lower endoscopy (colonoscopy or flexible sigmoidoscopy):  Excessive amounts of blood in the stool  Significant tenderness or worsening of abdominal pains  Swelling of the abdomen that is new, acute  Fever of 100F or higher   For urgent or emergent issues, a gastroenterologist can be reached at any hour by calling [phone].   DIET:  We do recommend a small meal at first, but then you may proceed to your regular diet.  Drink plenty of fluids but you should avoid alcoholic beverages for 24 hours.  ACTIVITY:  You should plan to take it easy for the rest of today and you should NOT DRIVE or use  heavy machinery until tomorrow (because of the sedation medicines used during the test).    FOLLOW UP: Our staff will call the number listed on your records the next business day following your procedure to check on you and address any questions or concerns that you may have regarding the information given to you following your procedure. If we do not reach you, we will leave a message.  However, if you are feeling well and you are not experiencing any problems, there is no need to return our call.  We will assume that you have returned to your regular daily activities without incident.  If any biopsies were taken you will be contacted by phone or by letter within the next 1-3 weeks.  Please call us at [phone] if you have not heard about the biopsies in 3 weeks.    SIGNATURES/CONFIDENTIALITY: You and/or your care partner have signed paperwork which will be entered into your electronic medical record.  These signatures attest to the fact that that the information above on your After Visit Summary has been reviewed and is understood.  Full responsibility of the confidentiality of this discharge information lies with you and/or your care-partner. 

## 2018-04-01 NOTE — Progress Notes (Signed)
PT taken to PACU. Monitors in place. VSS. Report given to RN. 

## 2018-04-01 NOTE — Progress Notes (Signed)
Called to room to assist during endoscopic procedure.  Patient ID and intended procedure confirmed with present staff. Received instructions for my participation in the procedure from the performing physician.  

## 2018-04-01 NOTE — Op Note (Signed)
[facility] Patient Name: Marie [name] Procedure Date: 04/01/2018 11:08 AM MRN: [id number] Endoscopist: Remo Lipps P. Havery Moros , MD Age: 73 Referring MD:  Date of Birth: [birth year] Gender: Female Account #: [id number] Procedure:                Colonoscopy Indications:              Screening in patient at increased risk: Family                            history of 1st-degree relative with colorectal                            cancer (brother diagnosed age 67s) Medicines:                Monitored Anesthesia Care Procedure:                Pre-Anesthesia Assessment:                           - Prior to the procedure, a History and Physical                            was performed, and patient medications and                            allergies were reviewed. The patient's tolerance of                            previous anesthesia was also reviewed. The risks                            and benefits of the procedure and the sedation                            options and risks were discussed with the patient.                            All questions were answered, and informed consent                            was obtained. Prior Anticoagulants: The patient has                            taken no previous anticoagulant or antiplatelet                            agents. ASA Grade Assessment: II - A patient with                            mild systemic disease. After reviewing the risks                            and benefits, the patient was deemed in  satisfactory condition to undergo the procedure.                           After obtaining informed consent, the colonoscope                            was passed under direct vision. Throughout the                            procedure, the patient's blood pressure, pulse, and                            oxygen saturations were monitored continuously. The                            Model CF-HQ190L  [id number]) scope was introduced                            through the anus and advanced to the the cecum,                            identified by appendiceal orifice and ileocecal                            valve. The colonoscopy was technically difficult                            and complex due to inadequate bowel prep. The                            patient tolerated the procedure well. The quality                            of the bowel preparation was fair. The ileocecal                            valve, appendiceal orifice, and rectum were                            photographed. Scope In: 11:23:01 AM Scope Out: 11:47:07 AM Scope Withdrawal Time: 0 hours 14 minutes 42 seconds  Total Procedure Duration: 0 hours 24 minutes 6 seconds  Findings:                 The perianal and digital rectal examinations were                            normal.                           A 5 mm polyp was found in the ascending colon. The                            polyp was flat. The polyp was removed with a cold  snare. Resection and retrieval were complete.                           Two flat polyps were found in the cecum. The polyps                            were 5 to 6 mm in size. These polyps were removed                            with a cold snare. Resection and retrieval were                            complete.                           Internal hemorrhoids were found during retroflexion.                           A large amount of semi-liquid stool was found in                            the entire colon, making visualization difficult,                            particularly in the right colon and cecum. Lavage                            of the colon was performed using copious amounts of                            sterile water, resulting in incomplete clearance                            with fair visualization. Some portions of the colon                             were not well visualized, cecum could not be                            cleared due to stool clogging the colonoscope                           The exam was otherwise without abnormality. Complications:            No immediate complications. Estimated blood loss:                            Minimal. Estimated Blood Loss:     Estimated blood loss was minimal. Impression:               - Preparation of the colon was fair at best as                            outlined above, some portions could not  be cleared                           - One 5 mm polyp in the ascending colon, removed                            with a cold snare. Resected and retrieved.                           - Two 5 to 6 mm polyps in the cecum, removed with a                            cold snare. Resected and retrieved.                           - Internal hemorrhoids.                           - The examination was otherwise normal. Recommendation:           - Patient has a contact number available for                            emergencies. The signs and symptoms of potential                            delayed complications were discussed with the                            patient. Return to normal activities tomorrow.                            Written discharge instructions were provided to the                            patient.                           - Resume previous diet.                           - Continue present medications.                           - Repeat colonoscopy within 6 months because the                            bowel preparation was suboptimal. Remo Lipps P. Armbruster, MD 04/01/2018 11:52:54 AM This report has been signed electronically.

## 2018-04-02 ENCOUNTER — Telehealth: Payer: Self-pay

## 2018-04-02 NOTE — Telephone Encounter (Signed)
  Follow up Call-  Call back number 04/01/2018  Post procedure Call Back phone  # [phone]  Permission to leave phone message Yes  Some recent data might be hidden     Patient questions:  Do you have a fever, pain , or abdominal swelling? No. Pain Score  0 *  Have you tolerated food without any problems? Yes.    Have you been able to return to your normal activities? Yes.    Do you have any questions about your discharge instructions: Diet   No. Medications  No. Follow up visit  No.  Do you have questions or concerns about your Care? No.  Actions: * If pain score is 4 or above: No action needed, pain <4.

## 2018-04-02 NOTE — Telephone Encounter (Signed)
  Follow up Call-  Call back number 04/01/2018  Post procedure Call Back phone  # [phone]  Permission to leave phone message Yes  Some recent data might be hidden     Left message

## 2018-04-07 ENCOUNTER — Encounter: Payer: Self-pay | Admitting: Gastroenterology

## 2018-08-07 DIAGNOSIS — I872 Venous insufficiency (chronic) (peripheral): Secondary | ICD-10-CM | POA: Diagnosis not present

## 2018-08-07 DIAGNOSIS — E559 Vitamin D deficiency, unspecified: Secondary | ICD-10-CM | POA: Diagnosis not present

## 2018-08-07 DIAGNOSIS — M170 Bilateral primary osteoarthritis of knee: Secondary | ICD-10-CM | POA: Diagnosis not present

## 2018-08-07 DIAGNOSIS — I10 Essential (primary) hypertension: Secondary | ICD-10-CM | POA: Diagnosis not present

## 2018-08-07 DIAGNOSIS — E7800 Pure hypercholesterolemia, unspecified: Secondary | ICD-10-CM | POA: Diagnosis not present

## 2018-08-07 DIAGNOSIS — Z0000 Encounter for general adult medical examination without abnormal findings: Secondary | ICD-10-CM | POA: Diagnosis not present

## 2018-09-25 ENCOUNTER — Encounter: Payer: Self-pay | Admitting: Gastroenterology

## 2018-12-30 DIAGNOSIS — I10 Essential (primary) hypertension: Secondary | ICD-10-CM | POA: Diagnosis not present

## 2018-12-30 DIAGNOSIS — N3000 Acute cystitis without hematuria: Secondary | ICD-10-CM | POA: Diagnosis not present

## 2018-12-30 DIAGNOSIS — Z23 Encounter for immunization: Secondary | ICD-10-CM | POA: Diagnosis not present

## 2018-12-30 DIAGNOSIS — R300 Dysuria: Secondary | ICD-10-CM | POA: Diagnosis not present

## 2019-01-26 ENCOUNTER — Other Ambulatory Visit: Payer: Self-pay | Admitting: Family Medicine

## 2019-01-26 DIAGNOSIS — Z1231 Encounter for screening mammogram for malignant neoplasm of breast: Secondary | ICD-10-CM

## 2019-03-19 ENCOUNTER — Ambulatory Visit
Admission: RE | Admit: 2019-03-19 | Discharge: 2019-03-19 | Disposition: A | Discharge status: Home / Self Care | Payer: PPO | Source: Ambulatory Visit | Attending: Family Medicine | Admitting: Family Medicine

## 2019-03-19 ENCOUNTER — Other Ambulatory Visit: Payer: Self-pay

## 2019-03-19 DIAGNOSIS — Z1231 Encounter for screening mammogram for malignant neoplasm of breast: Secondary | ICD-10-CM | POA: Diagnosis not present

## 2019-04-09 IMAGING — MG DIGITAL SCREENING BILATERAL MAMMOGRAM WITH TOMO AND CAD
8 series · 8 of 24 positions shown · non-contrast
Comparison: Previous exam(s).

ACR Breast Density Category a: The breast tissue is almost entirely
fatty.

CLINICAL DATA: Screening.

EXAM:
DIGITAL SCREENING BILATERAL MAMMOGRAM WITH TOMO AND CAD

[R CC synth-2D]
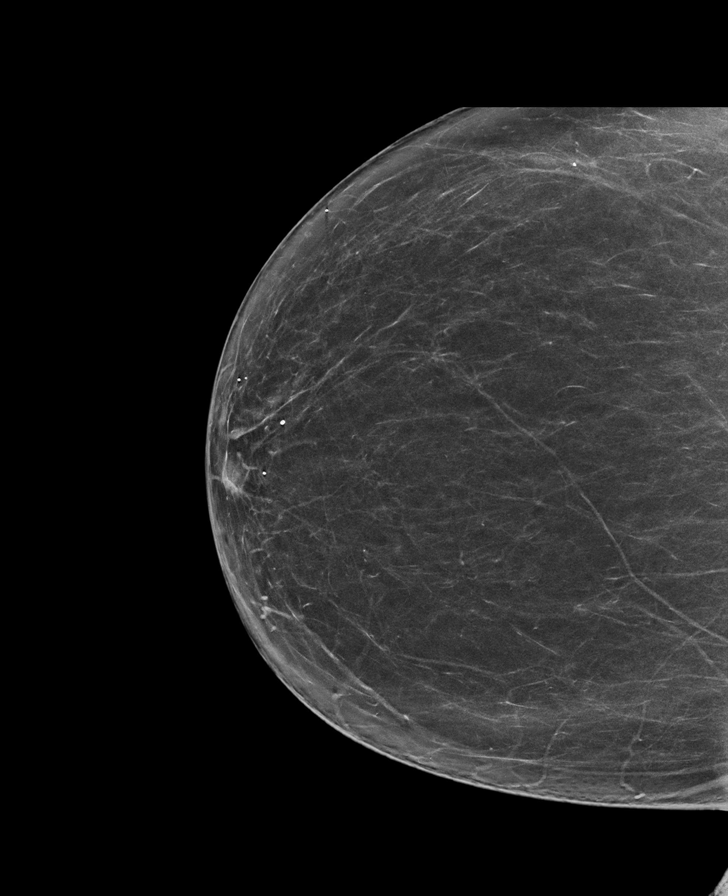

[L CC synth-2D]
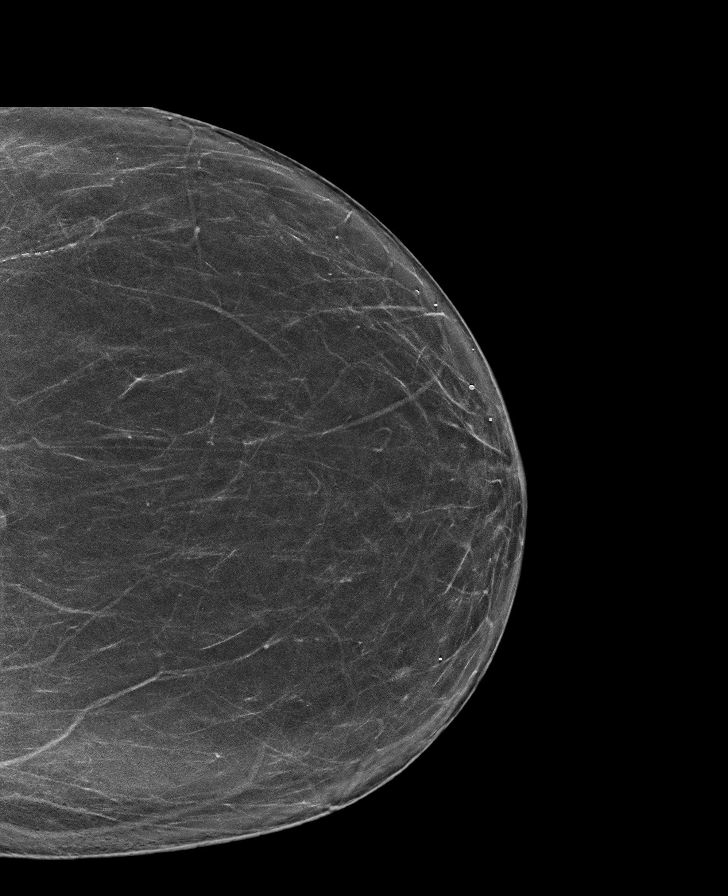

[L MLO synth-2D]
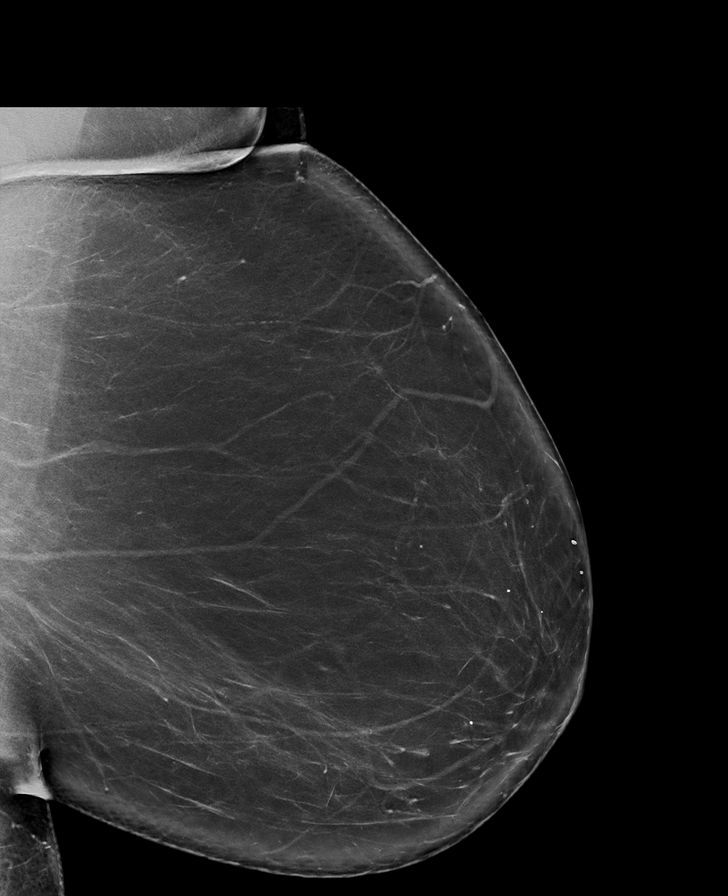

[R MLO synth-2D]
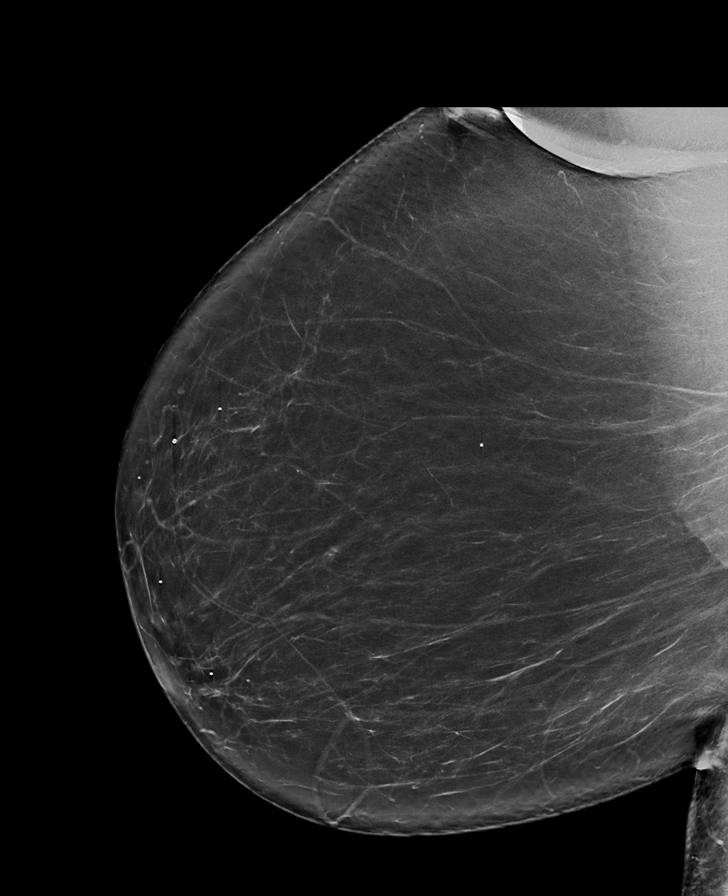

[R CC tomo · tomo slice 39/76.0]
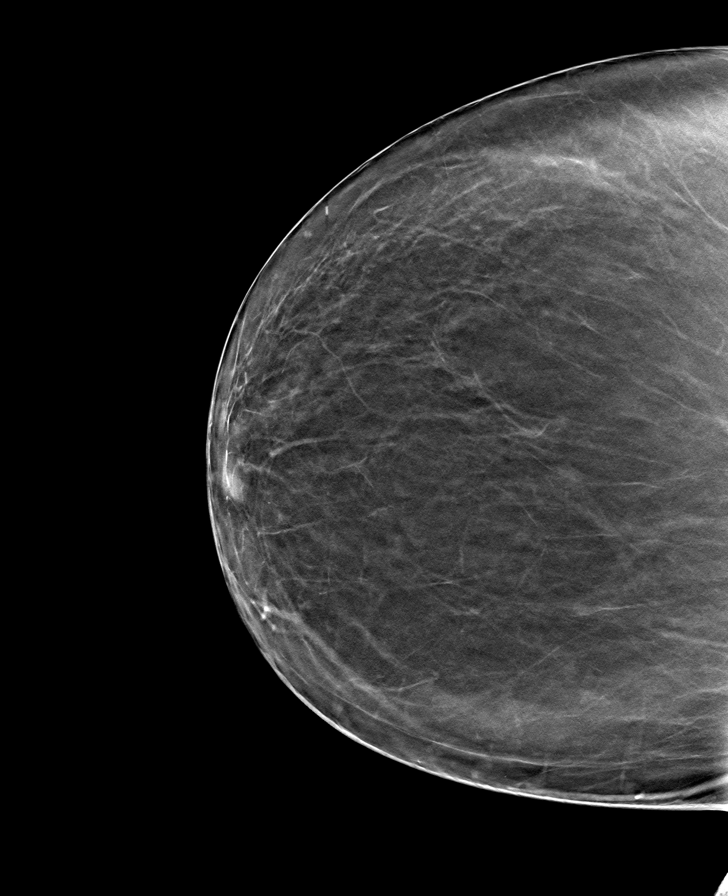

[L CC tomo · tomo slice 39/78.0]
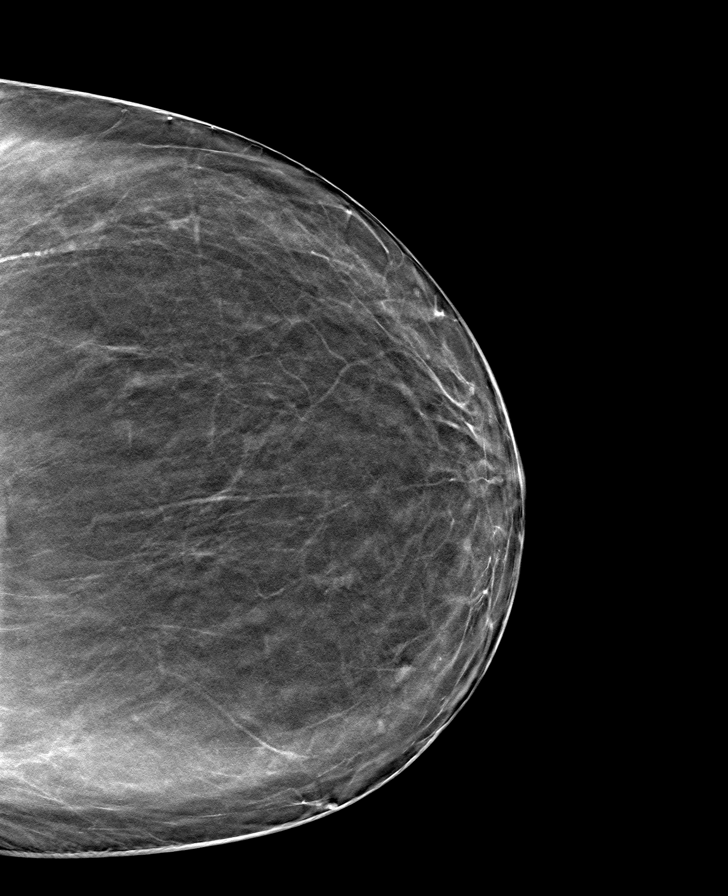

[L MLO tomo · tomo slice 51/102.0]
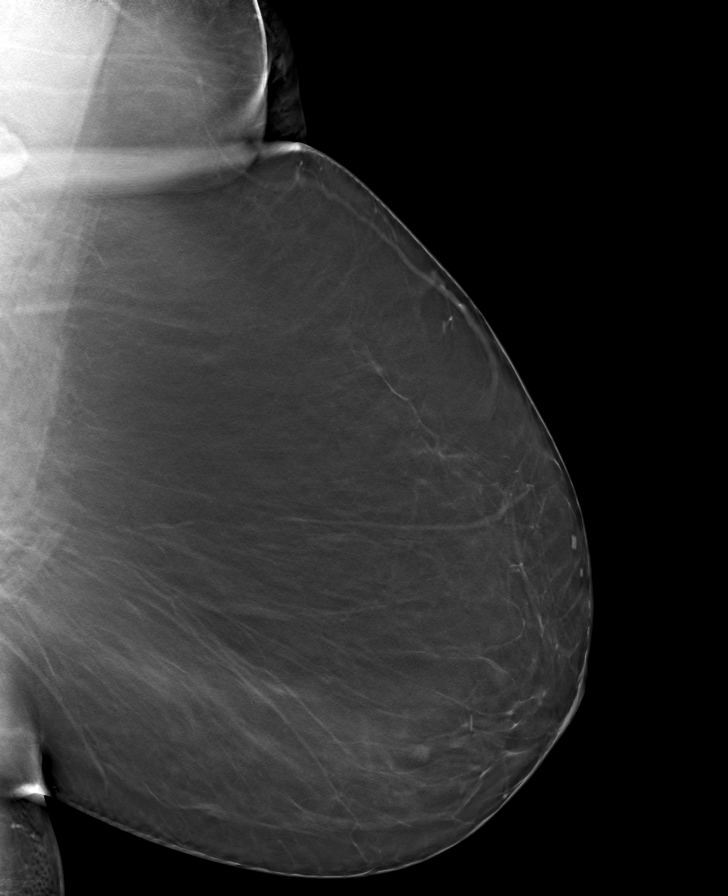

[R MLO tomo · tomo slice 45/88.0]
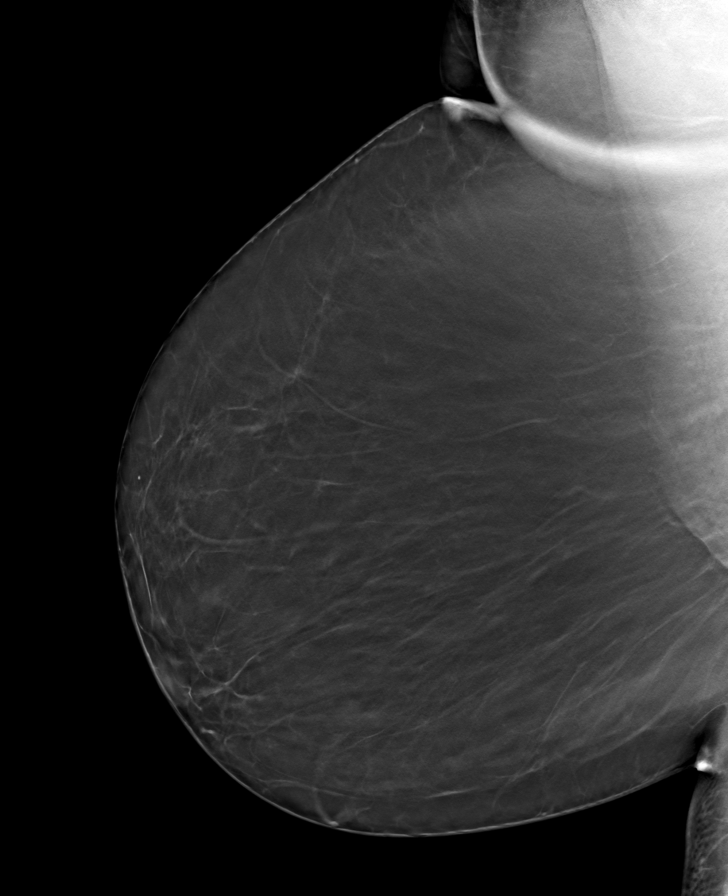

[8 of 24 positions shown; findings below may reference images not displayed]

FINDINGS: There are no findings suspicious for malignancy. Images were
processed with CAD.
IMPRESSION: No mammographic evidence of malignancy. A result letter of this
screening mammogram will be mailed directly to the patient.

RECOMMENDATION:
Screening mammogram in one year. (Code:8Y-[id number])

BI-RADS CATEGORY  1: Negative.

## 2019-06-01 DIAGNOSIS — R300 Dysuria: Secondary | ICD-10-CM | POA: Diagnosis not present

## 2019-06-01 DIAGNOSIS — I10 Essential (primary) hypertension: Secondary | ICD-10-CM | POA: Diagnosis not present

## 2019-06-01 DIAGNOSIS — N3000 Acute cystitis without hematuria: Secondary | ICD-10-CM | POA: Diagnosis not present

## 2019-06-11 ENCOUNTER — Ambulatory Visit: Payer: PPO | Attending: Family

## 2019-06-11 DIAGNOSIS — Z23 Encounter for immunization: Secondary | ICD-10-CM

## 2019-06-11 NOTE — Progress Notes (Signed)
   Covid-19 Vaccination Clinic  Name:  Marie [name]    MRN: [id number] DOB: 08/11/1945  06/11/2019  [name] was observed post Covid-19 immunization for 15 minutes without incident. She was provided with Vaccine Information Sheet and instruction to access the V-Safe system.   [name] was instructed to call 911 with any severe reactions post vaccine: Marland Kitchen Difficulty breathing  . Swelling of face and throat  . A fast heartbeat  . A bad rash all over body  . Dizziness and weakness   Immunizations Administered    Name Date Dose VIS Date Route   Moderna COVID-19 Vaccine 06/11/2019  2:01 PM 0.5 mL 02/03/2019 Intramuscular   Manufacturer: Moderna   Lot: [id number]   [id number]

## 2019-07-01 ENCOUNTER — Ambulatory Visit: Payer: PPO | Attending: Internal Medicine

## 2019-07-01 DIAGNOSIS — Z20822 Contact with and (suspected) exposure to covid-19: Secondary | ICD-10-CM | POA: Diagnosis not present

## 2019-07-02 LAB — NOVEL CORONAVIRUS, NAA: SARS-CoV-2, NAA: NOT DETECTED

## 2019-07-02 LAB — SARS-COV-2, NAA 2 DAY TAT

## 2019-07-14 ENCOUNTER — Ambulatory Visit: Payer: PPO | Attending: Family

## 2019-07-14 ENCOUNTER — Ambulatory Visit: Payer: PPO

## 2019-07-14 DIAGNOSIS — Z23 Encounter for immunization: Secondary | ICD-10-CM

## 2019-07-14 NOTE — Progress Notes (Signed)
   Covid-19 Vaccination Clinic  Name:  Marie [name]    MRN: [id number] DOB: 15-Jan-1946  07/14/2019  [name] was observed post Covid-19 immunization for 15 minutes without incident. She was provided with Vaccine Information Sheet and instruction to access the V-Safe system.   [name] was instructed to call 911 with any severe reactions post vaccine: Marland Kitchen Difficulty breathing  . Swelling of face and throat  . A fast heartbeat  . A bad rash all over body  . Dizziness and weakness   Immunizations Administered    Name Date Dose VIS Date Route   Moderna COVID-19 Vaccine 07/14/2019 12:26 PM 0.5 mL 02/2019 Intramuscular   Manufacturer: Moderna   Lot: [id number]   GuindaBE:[id number]

## 2019-08-28 DIAGNOSIS — I872 Venous insufficiency (chronic) (peripheral): Secondary | ICD-10-CM | POA: Diagnosis not present

## 2019-08-28 DIAGNOSIS — Z0000 Encounter for general adult medical examination without abnormal findings: Secondary | ICD-10-CM | POA: Diagnosis not present

## 2019-08-28 DIAGNOSIS — M810 Age-related osteoporosis without current pathological fracture: Secondary | ICD-10-CM | POA: Diagnosis not present

## 2019-08-28 DIAGNOSIS — E7800 Pure hypercholesterolemia, unspecified: Secondary | ICD-10-CM | POA: Diagnosis not present

## 2019-08-28 DIAGNOSIS — E559 Vitamin D deficiency, unspecified: Secondary | ICD-10-CM | POA: Diagnosis not present

## 2019-08-28 DIAGNOSIS — I10 Essential (primary) hypertension: Secondary | ICD-10-CM | POA: Diagnosis not present

## 2019-08-28 DIAGNOSIS — M170 Bilateral primary osteoarthritis of knee: Secondary | ICD-10-CM | POA: Diagnosis not present

## 2019-08-31 ENCOUNTER — Other Ambulatory Visit: Payer: Self-pay | Admitting: Family Medicine

## 2019-08-31 DIAGNOSIS — M810 Age-related osteoporosis without current pathological fracture: Secondary | ICD-10-CM

## 2019-09-09 DIAGNOSIS — M25561 Pain in right knee: Secondary | ICD-10-CM | POA: Diagnosis not present

## 2019-10-12 DIAGNOSIS — H5203 Hypermetropia, bilateral: Secondary | ICD-10-CM | POA: Diagnosis not present

## 2019-10-12 DIAGNOSIS — H43811 Vitreous degeneration, right eye: Secondary | ICD-10-CM | POA: Diagnosis not present

## 2019-10-12 DIAGNOSIS — H52203 Unspecified astigmatism, bilateral: Secondary | ICD-10-CM | POA: Diagnosis not present

## 2019-10-21 DIAGNOSIS — M25561 Pain in right knee: Secondary | ICD-10-CM | POA: Diagnosis not present

## 2019-11-20 ENCOUNTER — Ambulatory Visit
Admission: RE | Admit: 2019-11-20 | Discharge: 2019-11-20 | Disposition: A | Discharge status: Home / Self Care | Payer: PPO | Source: Ambulatory Visit | Attending: Family Medicine | Admitting: Family Medicine

## 2019-11-20 ENCOUNTER — Other Ambulatory Visit: Payer: Self-pay

## 2019-11-20 DIAGNOSIS — M810 Age-related osteoporosis without current pathological fracture: Secondary | ICD-10-CM

## 2019-11-20 DIAGNOSIS — Z780 Asymptomatic menopausal state: Secondary | ICD-10-CM | POA: Diagnosis not present

## 2019-11-20 DIAGNOSIS — M8589 Other specified disorders of bone density and structure, multiple sites: Secondary | ICD-10-CM | POA: Diagnosis not present

## 2020-02-29 DIAGNOSIS — Z03818 Encounter for observation for suspected exposure to other biological agents ruled out: Secondary | ICD-10-CM | POA: Diagnosis not present

## 2020-03-08 ENCOUNTER — Other Ambulatory Visit: Payer: Self-pay | Admitting: Family Medicine

## 2020-03-08 DIAGNOSIS — Z1231 Encounter for screening mammogram for malignant neoplasm of breast: Secondary | ICD-10-CM

## 2020-03-28 ENCOUNTER — Other Ambulatory Visit: Payer: PPO

## 2020-03-28 DIAGNOSIS — Z20822 Contact with and (suspected) exposure to covid-19: Secondary | ICD-10-CM

## 2020-03-29 LAB — NOVEL CORONAVIRUS, NAA: SARS-CoV-2, NAA: NOT DETECTED

## 2020-03-29 LAB — SARS-COV-2, NAA 2 DAY TAT

## 2020-04-14 ENCOUNTER — Other Ambulatory Visit: Payer: Self-pay

## 2020-04-14 ENCOUNTER — Ambulatory Visit
Admission: RE | Admit: 2020-04-14 | Discharge: 2020-04-14 | Disposition: A | Discharge status: Home / Self Care | Payer: PPO | Source: Ambulatory Visit | Attending: Family Medicine | Admitting: Family Medicine

## 2020-04-14 DIAGNOSIS — Z1231 Encounter for screening mammogram for malignant neoplasm of breast: Secondary | ICD-10-CM | POA: Diagnosis not present

## 2020-04-25 ENCOUNTER — Ambulatory Visit: Payer: PPO | Attending: Internal Medicine

## 2020-04-25 DIAGNOSIS — Z23 Encounter for immunization: Secondary | ICD-10-CM

## 2020-04-25 NOTE — Progress Notes (Signed)
   Covid-19 Vaccination Clinic  Name:  SHANOAH ASBILL    MRN: [id number] DOB: Dec 11, 1945  04/25/2020  Ms. Gasser was observed post Covid-19 immunization for 15 minutes without incident. She was provided with Vaccine Information Sheet and instruction to access the V-Safe system.   Ms. Foutz was instructed to call 911 with any severe reactions post vaccine: Marland Kitchen Difficulty breathing  . Swelling of face and throat  . A fast heartbeat  . A bad rash all over body  . Dizziness and weakness   Immunizations Administered    Name Date Dose VIS Date Route   Moderna Covid-19 Booster Vaccine 04/25/2020  1:11 PM 0.25 mL 12/23/2019 Intramuscular   Manufacturer: Moderna   Lot: [id number]   [place]: [phone]

## 2020-05-12 DIAGNOSIS — N3000 Acute cystitis without hematuria: Secondary | ICD-10-CM | POA: Diagnosis not present

## 2020-05-24 DIAGNOSIS — R300 Dysuria: Secondary | ICD-10-CM | POA: Diagnosis not present

## 2020-08-30 DIAGNOSIS — E7800 Pure hypercholesterolemia, unspecified: Secondary | ICD-10-CM | POA: Diagnosis not present

## 2020-08-30 DIAGNOSIS — M810 Age-related osteoporosis without current pathological fracture: Secondary | ICD-10-CM | POA: Diagnosis not present

## 2020-08-30 DIAGNOSIS — I872 Venous insufficiency (chronic) (peripheral): Secondary | ICD-10-CM | POA: Diagnosis not present

## 2020-08-30 DIAGNOSIS — Z0000 Encounter for general adult medical examination without abnormal findings: Secondary | ICD-10-CM | POA: Diagnosis not present

## 2020-08-30 DIAGNOSIS — E559 Vitamin D deficiency, unspecified: Secondary | ICD-10-CM | POA: Diagnosis not present

## 2020-08-30 DIAGNOSIS — M170 Bilateral primary osteoarthritis of knee: Secondary | ICD-10-CM | POA: Diagnosis not present

## 2020-08-30 DIAGNOSIS — I10 Essential (primary) hypertension: Secondary | ICD-10-CM | POA: Diagnosis not present

## 2020-10-10 DIAGNOSIS — R0981 Nasal congestion: Secondary | ICD-10-CM | POA: Diagnosis not present

## 2020-10-10 DIAGNOSIS — R051 Acute cough: Secondary | ICD-10-CM | POA: Diagnosis not present

## 2020-10-10 DIAGNOSIS — U071 COVID-19: Secondary | ICD-10-CM | POA: Diagnosis not present

## 2020-10-26 DIAGNOSIS — R300 Dysuria: Secondary | ICD-10-CM | POA: Diagnosis not present

## 2020-12-14 DIAGNOSIS — H5213 Myopia, bilateral: Secondary | ICD-10-CM | POA: Diagnosis not present

## 2020-12-14 DIAGNOSIS — H43811 Vitreous degeneration, right eye: Secondary | ICD-10-CM | POA: Diagnosis not present

## 2021-01-03 DIAGNOSIS — R102 Pelvic and perineal pain: Secondary | ICD-10-CM | POA: Diagnosis not present

## 2021-01-03 DIAGNOSIS — R300 Dysuria: Secondary | ICD-10-CM | POA: Diagnosis not present

## 2021-01-03 DIAGNOSIS — R311 Benign essential microscopic hematuria: Secondary | ICD-10-CM | POA: Diagnosis not present

## 2021-01-20 ENCOUNTER — Other Ambulatory Visit: Payer: Self-pay | Admitting: Urology

## 2021-01-20 DIAGNOSIS — N3030 Trigonitis without hematuria: Secondary | ICD-10-CM | POA: Diagnosis not present

## 2021-01-20 DIAGNOSIS — R82998 Other abnormal findings in urine: Secondary | ICD-10-CM | POA: Diagnosis not present

## 2021-01-20 DIAGNOSIS — N3289 Other specified disorders of bladder: Secondary | ICD-10-CM | POA: Diagnosis not present

## 2021-01-20 DIAGNOSIS — N3001 Acute cystitis with hematuria: Secondary | ICD-10-CM | POA: Diagnosis not present

## 2021-01-20 DIAGNOSIS — N3021 Other chronic cystitis with hematuria: Secondary | ICD-10-CM | POA: Diagnosis not present

## 2021-01-20 DIAGNOSIS — N3031 Trigonitis with hematuria: Secondary | ICD-10-CM | POA: Diagnosis not present

## 2021-01-20 DIAGNOSIS — N3081 Other cystitis with hematuria: Secondary | ICD-10-CM | POA: Diagnosis not present

## 2021-02-02 DIAGNOSIS — R300 Dysuria: Secondary | ICD-10-CM | POA: Diagnosis not present

## 2021-03-07 ENCOUNTER — Other Ambulatory Visit: Payer: Self-pay | Admitting: Family Medicine

## 2021-03-07 DIAGNOSIS — Z1231 Encounter for screening mammogram for malignant neoplasm of breast: Secondary | ICD-10-CM

## 2021-03-13 DIAGNOSIS — Z23 Encounter for immunization: Secondary | ICD-10-CM | POA: Diagnosis not present

## 2021-03-13 DIAGNOSIS — E7800 Pure hypercholesterolemia, unspecified: Secondary | ICD-10-CM | POA: Diagnosis not present

## 2021-03-13 DIAGNOSIS — I10 Essential (primary) hypertension: Secondary | ICD-10-CM | POA: Diagnosis not present

## 2021-04-19 ENCOUNTER — Ambulatory Visit
Admission: RE | Admit: 2021-04-19 | Discharge: 2021-04-19 | Disposition: A | Discharge status: Home / Self Care | Payer: PPO | Source: Ambulatory Visit | Attending: Family Medicine | Admitting: Family Medicine

## 2021-04-19 DIAGNOSIS — Z1231 Encounter for screening mammogram for malignant neoplasm of breast: Secondary | ICD-10-CM

## 2021-10-11 DIAGNOSIS — M25512 Pain in left shoulder: Secondary | ICD-10-CM | POA: Diagnosis not present

## 2021-10-11 DIAGNOSIS — M19012 Primary osteoarthritis, left shoulder: Secondary | ICD-10-CM | POA: Diagnosis not present

## 2021-10-18 DIAGNOSIS — U071 COVID-19: Secondary | ICD-10-CM | POA: Diagnosis not present

## 2021-12-19 DIAGNOSIS — H25813 Combined forms of age-related cataract, bilateral: Secondary | ICD-10-CM | POA: Diagnosis not present

## 2021-12-19 DIAGNOSIS — H5203 Hypermetropia, bilateral: Secondary | ICD-10-CM | POA: Diagnosis not present

## 2021-12-21 DIAGNOSIS — R300 Dysuria: Secondary | ICD-10-CM | POA: Diagnosis not present

## 2021-12-21 DIAGNOSIS — Z23 Encounter for immunization: Secondary | ICD-10-CM | POA: Diagnosis not present

## 2021-12-21 DIAGNOSIS — M170 Bilateral primary osteoarthritis of knee: Secondary | ICD-10-CM | POA: Diagnosis not present

## 2021-12-21 DIAGNOSIS — I10 Essential (primary) hypertension: Secondary | ICD-10-CM | POA: Diagnosis not present

## 2021-12-21 DIAGNOSIS — Z1211 Encounter for screening for malignant neoplasm of colon: Secondary | ICD-10-CM | POA: Diagnosis not present

## 2021-12-21 DIAGNOSIS — E7800 Pure hypercholesterolemia, unspecified: Secondary | ICD-10-CM | POA: Diagnosis not present

## 2021-12-21 DIAGNOSIS — H6123 Impacted cerumen, bilateral: Secondary | ICD-10-CM | POA: Diagnosis not present

## 2021-12-21 DIAGNOSIS — M810 Age-related osteoporosis without current pathological fracture: Secondary | ICD-10-CM | POA: Diagnosis not present

## 2021-12-21 DIAGNOSIS — Z0000 Encounter for general adult medical examination without abnormal findings: Secondary | ICD-10-CM | POA: Diagnosis not present

## 2022-01-29 ENCOUNTER — Encounter: Payer: Self-pay | Admitting: Gastroenterology

## 2022-02-19 ENCOUNTER — Ambulatory Visit (AMBULATORY_SURGERY_CENTER): Payer: Self-pay | Admitting: *Deleted

## 2022-02-19 VITALS — Ht 66.0 in | Wt 219.0 lb

## 2022-02-19 DIAGNOSIS — Z800 Family history of malignant neoplasm of digestive organs: Secondary | ICD-10-CM

## 2022-02-19 MED ORDER — PEG 3350-KCL-NA BICARB-NACL 420 G PO SOLR: 4000.0000 mL | Freq: Once | ORAL | 0 refills | Status: AC

## 2022-02-19 NOTE — Progress Notes (Signed)
No egg or soy allergy known to patient  No issues known to pt with past sedation with any surgeries or procedures Patient denies ever being told they had issues or difficulty with intubation  No FH of Malignant Hyperthermia Pt is not on diet pills Pt is not on  home 02  Pt is not on blood thinners  Pt  issues with constipation,2 DAY PREP  No A fib or A flutter Have any cardiac testing pending--NO Pt instructed to use Singlecare.com or GoodRx for a price reduction on prep    Patient's chart reviewed by Osvaldo Angst CNRA prior to previsit and patient appropriate for the [place].  Previsit completed and red dot placed by patient's name on their procedure day (on provider's schedule).

## 2022-03-02 DIAGNOSIS — R1011 Right upper quadrant pain: Secondary | ICD-10-CM | POA: Diagnosis not present

## 2022-03-02 DIAGNOSIS — K760 Fatty (change of) liver, not elsewhere classified: Secondary | ICD-10-CM | POA: Diagnosis not present

## 2022-03-09 DIAGNOSIS — M170 Bilateral primary osteoarthritis of knee: Secondary | ICD-10-CM | POA: Diagnosis not present

## 2022-03-09 DIAGNOSIS — M25561 Pain in right knee: Secondary | ICD-10-CM | POA: Diagnosis not present

## 2022-03-20 ENCOUNTER — Other Ambulatory Visit: Payer: Self-pay | Admitting: Family Medicine

## 2022-03-20 DIAGNOSIS — Z1231 Encounter for screening mammogram for malignant neoplasm of breast: Secondary | ICD-10-CM

## 2022-03-22 ENCOUNTER — Emergency Department (HOSPITAL_COMMUNITY): Payer: PPO

## 2022-03-22 ENCOUNTER — Other Ambulatory Visit: Payer: Self-pay

## 2022-03-22 ENCOUNTER — Emergency Department (HOSPITAL_COMMUNITY)
Admission: EM | Admit: 2022-03-22 | Discharge: 2022-03-22 | Disposition: A | Discharge status: Home / Self Care | Payer: PPO | Attending: Emergency Medicine | Admitting: Emergency Medicine

## 2022-03-22 DIAGNOSIS — N12 Tubulo-interstitial nephritis, not specified as acute or chronic: Secondary | ICD-10-CM | POA: Diagnosis not present

## 2022-03-22 DIAGNOSIS — Z91040 Latex allergy status: Secondary | ICD-10-CM | POA: Insufficient documentation

## 2022-03-22 DIAGNOSIS — I10 Essential (primary) hypertension: Secondary | ICD-10-CM | POA: Insufficient documentation

## 2022-03-22 DIAGNOSIS — R109 Unspecified abdominal pain: Secondary | ICD-10-CM | POA: Diagnosis not present

## 2022-03-22 DIAGNOSIS — Z7982 Long term (current) use of aspirin: Secondary | ICD-10-CM | POA: Diagnosis not present

## 2022-03-22 DIAGNOSIS — R350 Frequency of micturition: Secondary | ICD-10-CM | POA: Diagnosis not present

## 2022-03-22 DIAGNOSIS — R079 Chest pain, unspecified: Secondary | ICD-10-CM | POA: Diagnosis not present

## 2022-03-22 LAB — CBC
HCT: 44.7 % (ref 36.0–46.0)
Hemoglobin: 14.9 g/dL (ref 12.0–15.0)
MCH: 29.8 pg (ref 26.0–34.0)
MCHC: 33.3 g/dL (ref 30.0–36.0)
MCV: 89.4 fL (ref 80.0–100.0)
Platelets: 256 10*3/uL (ref 150–400)
RBC: 5 MIL/uL (ref 3.87–5.11)
RDW: 12.5 % (ref 11.5–15.5)
WBC: 6.4 10*3/uL (ref 4.0–10.5)
nRBC: 0 % (ref 0.0–0.2)

## 2022-03-22 LAB — COMPREHENSIVE METABOLIC PANEL
ALT: 31 U/L (ref 0–44)
AST: 27 U/L (ref 15–41)
Albumin: 3.9 g/dL (ref 3.5–5.0)
Alkaline Phosphatase: 68 U/L (ref 38–126)
Anion gap: 8 (ref 5–15)
BUN: 8 mg/dL (ref 8–23)
CO2: 29 mmol/L (ref 22–32)
Calcium: 9.2 mg/dL (ref 8.9–10.3)
Chloride: 101 mmol/L (ref 98–111)
Creatinine, Ser: 0.8 mg/dL (ref 0.44–1.00)
GFR, Estimated: >60 mL/min (ref >60)
Glucose, Bld: 102 mg/dL — ABNORMAL HIGH (ref 70–99)
Potassium: 3.3 mmol/L — ABNORMAL LOW (ref 3.5–5.1)
Sodium: 138 mmol/L (ref 135–145)
Total Bilirubin: 0.4 mg/dL (ref 0.3–1.2)
Total Protein: 7 g/dL (ref 6.5–8.1)

## 2022-03-22 LAB — URINALYSIS, ROUTINE W REFLEX MICROSCOPIC
Bilirubin Urine: NEGATIVE
Glucose, UA: NEGATIVE mg/dL
Hgb urine dipstick: NEGATIVE
Ketones, ur: NEGATIVE mg/dL
Nitrite: NEGATIVE
Protein, ur: 30 mg/dL — AB
Specific Gravity, Urine: 1.032 — ABNORMAL HIGH (ref 1.005–1.030)
pH: 5 (ref 5.0–8.0)

## 2022-03-22 LAB — TROPONIN I (HIGH SENSITIVITY)
Troponin I (High Sensitivity): 7 ng/L (ref <18)
Troponin I (High Sensitivity): 6 ng/L (ref <18)

## 2022-03-22 LAB — LIPASE, BLOOD: Lipase: 66 U/L — ABNORMAL HIGH (ref 11–51)

## 2022-03-22 MED ORDER — CEPHALEXIN 500 MG PO CAPS: 500.0000 mg | ORAL_CAPSULE | Freq: Two times a day (BID) | ORAL | 0 refills | Status: AC

## 2022-03-22 NOTE — ED Notes (Signed)
Dc instructions and scripts reviewed with pt no questions or concerns at this time.  

## 2022-03-22 NOTE — ED Provider Triage Note (Signed)
Emergency Medicine Provider Triage Evaluation Note  Marie [name] , a 77 y.o. female  was evaluated in triage.  Pt complains of R side pain. States she had a fall around the end of December because of this pain and had a RUQ [place] which looked normal. States it's been intermittent. Worse with laying down.  Review of Systems  Positive: R side pain Negative: Fever, Dysuria  Physical Exam  BP (!) 156/85 (BP Location: Right Arm)   Pulse 65   Temp 98.1 F (36.7 C) (Oral)   Resp 20   SpO2 99%  Gen:   Awake, no distress  Resp:  Normal effort  MSK:   Moves extremities without difficulty  Other:  Abd soft  Medical Decision Making  Medically screening exam initiated at 5:55 AM.  Appropriate orders placed.  Marie [name] was informed that the remainder of the evaluation will be completed by another provider, this initial triage assessment does not replace that evaluation, and the importance of remaining in the ED until their evaluation is complete.  Labs, [place], [place], [place] 03/22/22 [phone]

## 2022-03-22 NOTE — ED Triage Notes (Signed)
Patient reports intermittent pain at right lateral ribcage and mid back pain onset 3 weeks ago after a fall .

## 2022-03-22 NOTE — ED Provider Notes (Signed)
[facility] EMERGENCY DEPARTMENT Provider Note   CSN: [id number] Arrival date & time: 03/22/22  9528     History  Chief Complaint  Patient presents with   Nilda Riggs is a 77 y.o. female.  The history is provided by the patient and medical records. No language interpreter was used.  Fall Chronicity: last month. The current episode started more than 1 week ago. The problem occurs rarely. The problem has not changed since onset.Associated symptoms include abdominal pain (r flank and back). Pertinent negatives include no chest pain, no headaches and no shortness of breath. Nothing aggravates the symptoms. Nothing relieves the symptoms. She has tried nothing for the symptoms. The treatment provided no relief.       Home Medications Prior to Admission medications   Medication Sig Start Date End Date Taking? Authorizing Provider  acetaminophen (TYLENOL) 500 MG tablet Take 500 mg by mouth every 6 (six) hours as needed.    [provider]  Ascorbic Acid (VITAMIN C PO) Take 1 tablet by mouth daily as needed (immune health). Patient not taking: Reported on 02/19/2022    [provider]  aspirin 81 MG tablet Take 81 mg by mouth daily.    [provider]  Cholecalciferol (VITAMIN D) 2000 units CAPS Take 2,000 Units by mouth daily.    [provider]  hydrochlorothiazide (HYDRODIURIL) 50 MG tablet Take 25-50 mg by mouth daily. May take an additional '25mg'$ s for shortness of breath/ fluid    [provider]  ibuprofen (ADVIL,MOTRIN) 200 MG tablet Take 800 mg by mouth every 6 (six) hours as needed for moderate pain.    [provider]  Multiple Vitamin (MULTIVITAMIN WITH MINERALS) TABS tablet Take 1 tablet by mouth daily.    [provider]  potassium chloride (K-DUR) 10 MEQ tablet Take 10 mEq by mouth daily.    [provider]  Red Yeast Rice 600 MG TABS Take 1,200 mg by mouth daily.     [provider]      Allergies    Cholesterol, Hydrocodone, Lipitor  [atorvastatin calcium], Morphine and related, Sulfa antibiotics, Codeine, Latex, Naproxen, and Penicillins    Review of Systems   Review of Systems  Constitutional:  Negative for chills, fatigue and fever.  HENT:  Negative for congestion.   Eyes:  Negative for visual disturbance.  Respiratory:  Negative for cough, chest tightness, shortness of breath and wheezing.   Cardiovascular:  Negative for chest pain and palpitations.  Gastrointestinal:  Positive for abdominal pain (r flank and back) and constipation. Negative for diarrhea and nausea.  Genitourinary:  Positive for flank pain, frequency and urgency. Negative for dysuria and hematuria.  Musculoskeletal:  Positive for back pain. Negative for neck pain and neck stiffness.  Skin:  Negative for rash and wound.  Neurological:  Negative for headaches.  Psychiatric/Behavioral:  Negative for confusion.   All other systems reviewed and are negative.   Physical Exam Updated Vital Signs BP (!) 142/67 (BP Location: Right Arm)   Pulse 64   Temp 98.4 F (36.9 C)   Resp 16   SpO2 98%  Physical Exam Vitals and nursing note reviewed.  Constitutional:      General: She is not in acute distress.    Appearance: She is well-developed. She is not ill-appearing, toxic-appearing or diaphoretic.  HENT:     Head: Normocephalic and atraumatic.     Nose: No congestion or rhinorrhea.  Mouth/Throat:     Mouth: Mucous membranes are moist.     Pharynx: No oropharyngeal exudate or posterior oropharyngeal erythema.  Eyes:     Extraocular Movements: Extraocular movements intact.     Conjunctiva/sclera: Conjunctivae normal.     Pupils: Pupils are equal, round, and reactive to light.  Cardiovascular:     Rate and Rhythm: Normal rate and regular rhythm.     Heart sounds: No murmur heard. Pulmonary:     Effort: Pulmonary effort is normal. No respiratory distress.      Breath sounds: Normal breath sounds. No wheezing, rhonchi or rales.  Chest:     Chest wall: No tenderness.  Abdominal:     General: Abdomen is flat.     Palpations: Abdomen is soft.     Tenderness: There is no abdominal tenderness. There is right CVA tenderness. There is no left CVA tenderness, guarding or rebound.  Musculoskeletal:        General: Tenderness present. No swelling.     Cervical back: Neck supple. No tenderness.     Right lower leg: No edema.     Left lower leg: No edema.  Skin:    General: Skin is warm and dry.     Capillary Refill: Capillary refill takes less than 2 seconds.     Findings: No erythema, lesion or rash.  Neurological:     General: No focal deficit present.     Mental Status: She is alert.     Sensory: No sensory deficit.     Motor: No weakness.  Psychiatric:        Mood and Affect: Mood normal.     ED Results / Procedures / Treatments   Labs (all labs ordered are listed, but only abnormal results are displayed) Labs Reviewed  LIPASE, BLOOD - Abnormal; Notable for the following components:      Result Value   Lipase 66 (*)    All other components within normal limits  COMPREHENSIVE METABOLIC PANEL - Abnormal; Notable for the following components:   Potassium 3.3 (*)    Glucose, Bld 102 (*)    All other components within normal limits  URINALYSIS, ROUTINE W REFLEX MICROSCOPIC - Abnormal; Notable for the following components:   Color, Urine AMBER (*)    APPearance HAZY (*)    Specific Gravity, Urine 1.032 (*)    Protein, ur 30 (*)    Leukocytes,Ua SMALL (*)    Bacteria, UA FEW (*)    Non Squamous Epithelial 0-5 (*)    All other components within normal limits  CBC  TROPONIN I (HIGH SENSITIVITY)  TROPONIN I (HIGH SENSITIVITY)    EKG None  Radiology CT ABDOMEN PELVIS WO CONTRAST  Result Date: 03/22/2022 CLINICAL DATA:  Abdominal pain, acute, nonlocalized EXAM: CT ABDOMEN AND PELVIS WITHOUT CONTRAST TECHNIQUE: Multidetector CT imaging  of the abdomen and pelvis was performed following the standard protocol without IV contrast. RADIATION DOSE REDUCTION: This exam was performed according to the departmental dose-optimization program which includes automated exposure control, adjustment of the mA and/or kV according to patient size and/or use of iterative reconstruction technique. COMPARISON:  None Available. FINDINGS: Lower chest: No acute abnormality. Hepatobiliary: Mildly decreased liver density suggesting steatosis, with area of fatty sparing along the gallbladder fossa. The gallbladder is unremarkable. Pancreas: Unremarkable. No pancreatic ductal dilatation or surrounding inflammatory changes. Spleen: Normal in size without focal abnormality. Adrenals/Urinary Tract: Adrenal glands are unremarkable. No hydronephrosis or nephrolithiasis. The bladder is decompressed. Stomach/Bowel: The stomach is  within normal limits. There is no evidence of bowel obstruction.The appendix is normal. Large stool burden in the ascending and transverse colon. Vascular/Lymphatic: Aortoiliac atherosclerosis.  No lymphadenopathy. Reproductive: Prior hysterectomy. Other: There is a fat containing periumbilical hernia. No bowel containing hernia. No ascites. No free air. Musculoskeletal: No acute osseous abnormality. No suspicious osseous lesion. Degenerative changes of the sacroiliac joints with subchondral sclerosis, right greater than left. Moderate osteoarthritis. Degenerative changes of the spine, worst at L5-S1. IMPRESSION: No acute findings in the abdomen or pelvis. Mild hepatic steatosis. Significant stool burden in the ascending and transverse colon suggesting constipation. Fat containing paraumbilical hernia. Electronically Signed   By: Maurine Simmering M.D.   On: 03/22/2022 09:07   DG Chest 2 View  Result Date: 03/22/2022 CLINICAL DATA:  Encounter for chest pain EXAM: CHEST - 2 VIEW COMPARISON:  None Available. FINDINGS: Normal heart size and mediastinal  contours. No acute infiltrate or edema. No effusion or pneumothorax. No acute osseous findings. IMPRESSION: No active cardiopulmonary disease. Electronically Signed   By: Jorje Guild M.D.   On: 03/22/2022 06:27    Procedures Procedures    Medications Ordered in ED Medications - No data to display  ED Course/ Medical Decision Making/ A&P                             Medical Decision Making Risk Prescription drug management.    ERION HERMANS is a 77 y.o. female with a past medical history significant for hypertension, hyperlipidemia, GERD, and osteopenia who presents for right flank and back pain after fall several weeks ago.  Patient reports that she had a fall last month and then for the last few weeks she has had some pain in her right flank going towards her right back.  She also reports some urinary frequency and urgency and has had history of UTIs.  She otherwise denies any headache, neck pain or chest pain.  Denies shortness of breath.  Denies nausea, vomiting, or diarrhea but has chronic constipation.  No rashes or skin changes reported.  On exam, patient does have some right-sided CVA tenderness and right flank pain.  Normal bowel sounds.  Lungs clear.  Chest nontender.  Midline back nontender.  Good pulses in extremities.  Legs are nontender and nonedematous.  Patient well-appearing with reassuring vital signs initially.  Patient and workup in triage including a CT scan which did not show evidence of acute traumatic injuries.  Urinalysis was concerning for possible UTI given her symptoms and flank tenderness and pain.  I suspect he has early pyelonephritis going up her right flank.  Patient agrees with prescription for antibiotics and discharged home.  Otherwise her workup was reassuring.  Patient did not appear septic and troponin was negative.  CBC reassuring.  Metabolic panel otherwise reassuring.  Chest x-ray also reassuring.  Patient discharged in good condition with  antibiotics for outpatient treatment of pyelonephritis with understanding return precautions and follow-up instructions.         Final Clinical Impression(s) / ED Diagnoses Final diagnoses:  Pyelonephritis  Right flank pain  Urinary frequency    Rx / DC Orders ED Discharge Orders          Ordered    cephALEXin (KEFLEX) 500 MG capsule  2 times daily        03/22/22 1811            Clinical Impression: 1. Pyelonephritis   2. Right  flank pain   3. Urinary frequency     Disposition: Discharge  Condition: Good  I have discussed the results, Dx and Tx plan with the pt(& family if present). He/she/they expressed understanding and agree(s) with the plan. Discharge instructions discussed at great length. Strict return precautions discussed and pt &/or family have verbalized understanding of the instructions. No further questions at time of discharge.    Discharge Medication List as of 03/22/2022  6:12 PM     START taking these medications   Details  cephALEXin (KEFLEX) 500 MG capsule Take 1 capsule (500 mg total) by mouth 2 (two) times daily for 14 days., Starting Thu 03/22/2022, Until Thu 04/05/2022, Print        Follow Up: Shirline Frees, MD [address] [place] [address] [place] [address] [id number] mc [place]       Jayshon Dommer, Gwenyth Allegra, MD 03/23/22 [phone]

## 2022-03-22 NOTE — Discharge Instructions (Signed)
Your history, exam, workup today did not show evidence of acute traumatic injuries but did show possible urinary tract infection given the pain going to your flank and kidney area I suspect is an early pyelonephritis.  Please use the antibiotic to treat.  Please use over-the-counter medications help with pain.  If any symptoms change or worsen acutely, please return to the nearest emergency department. please follow-up with your primary doctor.

## 2022-03-23 ENCOUNTER — Telehealth: Payer: Self-pay | Admitting: Gastroenterology

## 2022-03-23 NOTE — Telephone Encounter (Signed)
Pt had a PV. Returned call to patient. She is taking antibiotics for a kidney infection. I informed patient that it is fine for her to take her necessary medications as long as it is before 8:30 am the morning of her procedure. Pt also states that she takes Aspirin 81 mg and I informed her that it was fine to continue this. Pt verbalized understanding and had no concerns at the end of the call.

## 2022-03-23 NOTE — Telephone Encounter (Signed)
PT has colonoscopy on 1/24 and is currently on antibiotics and wants to know can she still take those and prep. Please advise

## 2022-03-26 ENCOUNTER — Encounter: Payer: Self-pay | Admitting: Gastroenterology

## 2022-03-28 ENCOUNTER — Ambulatory Visit (AMBULATORY_SURGERY_CENTER): Payer: PPO | Admitting: Gastroenterology

## 2022-03-28 ENCOUNTER — Encounter: Payer: Self-pay | Admitting: Gastroenterology

## 2022-03-28 VITALS — BP 137/86 | HR 65 | Temp 97.3°F | Resp 13 | Ht 64.0 in | Wt 219.0 lb

## 2022-03-28 DIAGNOSIS — Z09 Encounter for follow-up examination after completed treatment for conditions other than malignant neoplasm: Secondary | ICD-10-CM

## 2022-03-28 DIAGNOSIS — Z86010 Personal history of colonic polyps: Secondary | ICD-10-CM | POA: Diagnosis not present

## 2022-03-28 DIAGNOSIS — D120 Benign neoplasm of cecum: Secondary | ICD-10-CM | POA: Diagnosis not present

## 2022-03-28 DIAGNOSIS — D123 Benign neoplasm of transverse colon: Secondary | ICD-10-CM

## 2022-03-28 DIAGNOSIS — K635 Polyp of colon: Secondary | ICD-10-CM | POA: Diagnosis not present

## 2022-03-28 DIAGNOSIS — Z800 Family history of malignant neoplasm of digestive organs: Secondary | ICD-10-CM | POA: Diagnosis not present

## 2022-03-28 MED ORDER — SODIUM CHLORIDE 0.9 % IV SOLN: 500.0000 mL | INTRAVENOUS | Status: DC

## 2022-03-28 NOTE — Op Note (Signed)
[place] Patient Name: Marie [name] Procedure Date: 03/28/2022 11:23 AM MRN: [id number] Endoscopist: Remo Lipps P. Havery Moros , MD, [id number] Age: 77 Referring MD:  Date of Birth: 23-Jun-1945 Gender: Female Account #: [id number] Procedure:                Colonoscopy Indications:              High risk colon cancer surveillance: Personal                            history of colonic polyps - 3 polyps removed 03/2018                            but fair prep. Brother with colon cancer dx age                            74s. Double prep for this exam. Medicines:                Monitored Anesthesia Care Procedure:                Pre-Anesthesia Assessment:                           - Prior to the procedure, a History and Physical                            was performed, and patient medications and                            allergies were reviewed. The patient's tolerance of                            previous anesthesia was also reviewed. The risks                            and benefits of the procedure and the sedation                            options and risks were discussed with the patient.                            All questions were answered, and informed consent                            was obtained. Prior Anticoagulants: The patient has                            taken no anticoagulant or antiplatelet agents. ASA                            Grade Assessment: III - A patient with severe                            systemic disease. After reviewing the risks and  benefits, the patient was deemed in satisfactory                            condition to undergo the procedure.                           After obtaining informed consent, the colonoscope                            was passed under direct vision. Throughout the                            procedure, the patient's blood pressure, pulse, and                            oxygen saturations  were monitored continuously. The                            Olympus CF-HQ190L (Serial# [id number]) Colonoscope was                            introduced through the anus and advanced to the the                            cecum, identified by appendiceal orifice and                            ileocecal valve. The colonoscopy was performed                            without difficulty. The patient tolerated the                            procedure well. The quality of the bowel                            preparation was adequate. The ileocecal valve,                            appendiceal orifice, and rectum were photographed. Scope In: 11:28:01 AM Scope Out: 11:58:19 AM Scope Withdrawal Time: 0 hours 21 minutes 5 seconds  Total Procedure Duration: 0 hours 30 minutes 18 seconds  Findings:                 The perianal and digital rectal examinations were                            normal.                           Two flat polyps were found in the cecum. The polyps                            were 3 to 4 mm in size. These polyps were removed  with a cold snare. Resection and retrieval were                            complete.                           A 4 mm polyp was found in the hepatic flexure. The                            polyp was sessile. The polyp was removed with a                            cold snare. Resection and retrieval were complete.                           Two flat and sessile polyps were found in the                            transverse colon. The polyps were 4 to 5 mm in                            size. These polyps were removed with a cold snare.                            Resection and retrieval were complete.                           The colon was tortuous.                           Internal hemorrhoids were found during retroflexion.                           The exam was otherwise without abnormality. Complications:            No immediate  complications. Estimated blood loss:                            Minimal. Estimated Blood Loss:     Estimated blood loss was minimal. Impression:               - Two 3 to 4 mm polyps in the cecum, removed with a                            cold snare. Resected and retrieved.                           - One 4 mm polyp at the hepatic flexure, removed                            with a cold snare. Resected and retrieved.                           - Two 4 to 5 mm polyps in  the transverse colon,                            removed with a cold snare. Resected and retrieved.                           - Tortuous colon.                           - Internal hemorrhoids.                           - The examination was otherwise normal. Recommendation:           - Patient has a contact number available for                            emergencies. The signs and symptoms of potential                            delayed complications were discussed with the                            patient. Return to normal activities tomorrow.                            Written discharge instructions were provided to the                            patient.                           - Resume previous diet.                           - Continue present medications.                           - Await pathology results. Remo Lipps P. Oskar Cretella, MD 03/28/2022 12:04:01 PM This report has been signed electronically.

## 2022-03-28 NOTE — Progress Notes (Signed)
Pt's states no medical or surgical changes since previsit or office visit. 

## 2022-03-28 NOTE — Progress Notes (Signed)
[facility] History and Physical   Primary Care Physician:  Shirline Frees, MD   Reason for Procedure:   History of colon polyps / family history of colon cancer - last exam limited by fair prep  Plan:    colonoscopy     HPI: Marie [name] is a 77 y.o. female  here for colonoscopy surveillance. Last exam 03/2018 - a few polyps removed but fair prep.   Patient does have some chronic constipation.   Otherwise feels well without any cardiopulmonary symptoms.   I have discussed risks / benefits of anesthesia and endoscopic procedure with Secundino Ginger and they wish to proceed with the exams as outlined today.    Past Medical History:  Diagnosis Date   Allergy    SEASONAL   Arthritis    shoulder fracture , ankle    Blood transfusion without reported diagnosis    with pregnancy    Cataract    BEGINING STAGES   Fibrocystic breast    GERD (gastroesophageal reflux disease)    diet related    Hyperlipidemia    hx of and on red yeast rice    Hypertension    Neuromuscular disorder (HCC)    FROM CHOLESTEROL MEDICINE   Osteopenia     Past Surgical History:  Procedure Laterality Date   ABDOMINAL HYSTERECTOMY     ANKLE SURGERY     BLADDER BIOPSY     BREAST EXCISIONAL BIOPSY Left 1987   benign   BREAST SURGERY     COLONOSCOPY     ENDOVENOUS ABLATION SAPHENOUS VEIN W/ LASER Right 07/01/2017   endovenous laser ablation R SSV by Tinnie Gens MD   HAND SURGERY     ORIF SHOULDER FRACTURE     no surgery   TONSILLECTOMY     VEIN SURGERY      Prior to Admission medications   Medication Sig Start Date End Date Taking? Authorizing Provider  acetaminophen (TYLENOL) 500 MG tablet Take 500 mg by mouth every 6 (six) hours as needed.    [provider]  Ascorbic Acid (VITAMIN C PO) Take 1 tablet by mouth daily as needed (immune health). Patient not taking: Reported on 02/19/2022    [provider]  aspirin 81 MG tablet Take 81 mg by mouth daily.     [provider]  cephALEXin (KEFLEX) 500 MG capsule Take 1 capsule (500 mg total) by mouth 2 (two) times daily for 14 days. 03/22/22 04/05/22  Tegeler, Gwenyth Allegra, MD  Cholecalciferol (VITAMIN D) 2000 units CAPS Take 2,000 Units by mouth daily.    [provider]  hydrochlorothiazide (HYDRODIURIL) 50 MG tablet Take 25-50 mg by mouth daily. May take an additional '25mg'$ s for shortness of breath/ fluid    [provider]  ibuprofen (ADVIL,MOTRIN) 200 MG tablet Take 800 mg by mouth every 6 (six) hours as needed for moderate pain.    [provider]  Multiple Vitamin (MULTIVITAMIN WITH MINERALS) TABS tablet Take 1 tablet by mouth daily.    [provider]  potassium chloride (K-DUR) 10 MEQ tablet Take 10 mEq by mouth daily.    [provider]  Red Yeast Rice 600 MG TABS Take 1,200 mg by mouth daily.    [provider]    Current Outpatient Medications  Medication Sig Dispense Refill   acetaminophen (TYLENOL) 500 MG tablet Take 500 mg by mouth every 6 (six) hours as needed.     Ascorbic Acid (VITAMIN C PO) Take 1  tablet by mouth daily as needed (immune health). (Patient not taking: Reported on 02/19/2022)     aspirin 81 MG tablet Take 81 mg by mouth daily.     cephALEXin (KEFLEX) 500 MG capsule Take 1 capsule (500 mg total) by mouth 2 (two) times daily for 14 days. 28 capsule 0   Cholecalciferol (VITAMIN D) 2000 units CAPS Take 2,000 Units by mouth daily.     hydrochlorothiazide (HYDRODIURIL) 50 MG tablet Take 25-50 mg by mouth daily. May take an additional '25mg'$ s for shortness of breath/ fluid     ibuprofen (ADVIL,MOTRIN) 200 MG tablet Take 800 mg by mouth every 6 (six) hours as needed for moderate pain.     Multiple Vitamin (MULTIVITAMIN WITH MINERALS) TABS tablet Take 1 tablet by mouth daily.     potassium chloride (K-DUR) 10 MEQ tablet Take 10 mEq by mouth daily.     Red Yeast Rice 600 MG TABS Take 1,200 mg by mouth daily.      Current Facility-Administered Medications  Medication Dose Route Frequency Provider Last Rate Last Admin   0.9 %  sodium chloride infusion  500 mL Intravenous Continuous Geremiah Fussell, Carlota Raspberry, MD        Allergies as of 03/28/2022 - Review Complete 03/28/2022  Allergen Reaction Noted   Cholesterol  10/07/2013   Hydrocodone Hives 10/07/2013   Lipitor  [atorvastatin calcium]  03/18/2018   Morphine and related Nausea And Vomiting 03/18/2018   Sulfa antibiotics Hives 03/18/2018   Codeine Rash 10/07/2013   Latex Rash 09/19/2016   Naproxen Hives 10/07/2013   Penicillins Rash and Other (See Comments) 10/07/2013    Family History  Problem Relation Age of Onset   Diabetes Mother    Esophageal cancer Father    Breast cancer Sister 7   Breast cancer Sister    Lung cancer Sister    Stroke Sister    Pancreatic cancer Sister    Colon cancer Brother        Dx'd late 85's, early 77's   Prostate cancer Brother    Colon polyps Neg Hx    Crohn's disease Neg Hx    Rectal cancer Neg Hx    Stomach cancer Neg Hx    Ulcerative colitis Neg Hx     Social History   Socioeconomic History   Marital status: Divorced    Spouse name: Not on file   Number of children: Not on file   Years of education: Not on file   Highest education level: Not on file  Occupational History   Not on file  Tobacco Use   Smoking status: Former    Types: Cigarettes    Passive exposure: Never   Smokeless tobacco: Never   Tobacco comments:    20 yrs ago  Vaping Use   Vaping Use: Never used  Substance and Sexual Activity   Alcohol use: No   Drug use: No   Sexual activity: Not on file  Other Topics Concern   Not on file  Social History Narrative   Not on file   Social Determinants of Health   Financial Resource Strain: Not on file  Food Insecurity: Not on file  Transportation Needs: Not on file  Physical Activity: Not on file  Stress: Not on file  Social Connections: Not on file  Intimate Partner  Violence: Not on file    Review of Systems: All other review of systems negative except as mentioned in the HPI.  Physical Exam: Vital signs BP 129/71  Pulse 70   Temp (!) 97.3 F (36.3 C) (Skin)   Ht '5\' 4"'$  (1.626 m)   Wt 219 lb (99.3 kg)   SpO2 96%   BMI 37.59 kg/m   General:   Alert,  Well-developed, pleasant and cooperative in NAD Lungs:  Clear throughout to auscultation.   Heart:  Regular rate and rhythm Abdomen:  Soft, nontender and nondistended.   Neuro/Psych:  Alert and cooperative. Normal mood and affect. A and O x 3  Jolly Mango, MD [facility]

## 2022-03-28 NOTE — Progress Notes (Signed)
Called to room to assist during endoscopic procedure.  Patient ID and intended procedure confirmed with present staff. Received instructions for my participation in the procedure from the performing physician.  

## 2022-03-28 NOTE — Patient Instructions (Signed)

## 2022-03-28 NOTE — Progress Notes (Signed)
I have reviewed the patient's medical history in detail and updated the computerized patient record. DT, NT obtained patient VS.

## 2022-03-29 ENCOUNTER — Telehealth: Payer: Self-pay

## 2022-03-29 NOTE — Telephone Encounter (Signed)
  Follow up Call-     03/28/2022   10:56 AM  Call back number  Post procedure Call Back phone  # [phone]  Permission to leave phone message Yes     Patient questions:  Do you have a fever, pain , or abdominal swelling? No. Pain Score  0 *  Have you tolerated food without any problems? Yes.    Have you been able to return to your normal activities? Yes.    Do you have any questions about your discharge instructions: Diet   No. Medications  No. Follow up visit  No.  Do you have questions or concerns about your Care? No.  Actions: * If pain score is 4 or above: No action needed, pain <4.

## 2022-04-02 DIAGNOSIS — M25661 Stiffness of right knee, not elsewhere classified: Secondary | ICD-10-CM | POA: Diagnosis not present

## 2022-04-02 DIAGNOSIS — M25561 Pain in right knee: Secondary | ICD-10-CM | POA: Diagnosis not present

## 2022-04-02 DIAGNOSIS — M1711 Unilateral primary osteoarthritis, right knee: Secondary | ICD-10-CM | POA: Diagnosis not present

## 2022-04-05 DIAGNOSIS — M1711 Unilateral primary osteoarthritis, right knee: Secondary | ICD-10-CM | POA: Diagnosis not present

## 2022-04-09 DIAGNOSIS — M25661 Stiffness of right knee, not elsewhere classified: Secondary | ICD-10-CM | POA: Diagnosis not present

## 2022-04-09 DIAGNOSIS — M25561 Pain in right knee: Secondary | ICD-10-CM | POA: Diagnosis not present

## 2022-04-09 DIAGNOSIS — M1711 Unilateral primary osteoarthritis, right knee: Secondary | ICD-10-CM | POA: Diagnosis not present

## 2022-04-11 DIAGNOSIS — M1711 Unilateral primary osteoarthritis, right knee: Secondary | ICD-10-CM | POA: Diagnosis not present

## 2022-04-11 DIAGNOSIS — M25561 Pain in right knee: Secondary | ICD-10-CM | POA: Diagnosis not present

## 2022-04-11 DIAGNOSIS — M25661 Stiffness of right knee, not elsewhere classified: Secondary | ICD-10-CM | POA: Diagnosis not present

## 2022-04-12 DIAGNOSIS — M1711 Unilateral primary osteoarthritis, right knee: Secondary | ICD-10-CM | POA: Diagnosis not present

## 2022-04-16 DIAGNOSIS — M1711 Unilateral primary osteoarthritis, right knee: Secondary | ICD-10-CM | POA: Diagnosis not present

## 2022-04-16 DIAGNOSIS — M25661 Stiffness of right knee, not elsewhere classified: Secondary | ICD-10-CM | POA: Diagnosis not present

## 2022-04-16 DIAGNOSIS — M25561 Pain in right knee: Secondary | ICD-10-CM | POA: Diagnosis not present

## 2022-04-18 DIAGNOSIS — M25661 Stiffness of right knee, not elsewhere classified: Secondary | ICD-10-CM | POA: Diagnosis not present

## 2022-04-18 DIAGNOSIS — M25561 Pain in right knee: Secondary | ICD-10-CM | POA: Diagnosis not present

## 2022-04-18 DIAGNOSIS — M1711 Unilateral primary osteoarthritis, right knee: Secondary | ICD-10-CM | POA: Diagnosis not present

## 2022-04-19 DIAGNOSIS — M1711 Unilateral primary osteoarthritis, right knee: Secondary | ICD-10-CM | POA: Diagnosis not present

## 2022-04-23 DIAGNOSIS — M1711 Unilateral primary osteoarthritis, right knee: Secondary | ICD-10-CM | POA: Diagnosis not present

## 2022-04-23 DIAGNOSIS — M25561 Pain in right knee: Secondary | ICD-10-CM | POA: Diagnosis not present

## 2022-04-23 DIAGNOSIS — M25661 Stiffness of right knee, not elsewhere classified: Secondary | ICD-10-CM | POA: Diagnosis not present

## 2022-04-25 DIAGNOSIS — M1711 Unilateral primary osteoarthritis, right knee: Secondary | ICD-10-CM | POA: Diagnosis not present

## 2022-04-25 DIAGNOSIS — M25661 Stiffness of right knee, not elsewhere classified: Secondary | ICD-10-CM | POA: Diagnosis not present

## 2022-04-25 DIAGNOSIS — M25561 Pain in right knee: Secondary | ICD-10-CM | POA: Diagnosis not present

## 2022-04-30 DIAGNOSIS — M25561 Pain in right knee: Secondary | ICD-10-CM | POA: Diagnosis not present

## 2022-04-30 DIAGNOSIS — M25661 Stiffness of right knee, not elsewhere classified: Secondary | ICD-10-CM | POA: Diagnosis not present

## 2022-04-30 DIAGNOSIS — M1711 Unilateral primary osteoarthritis, right knee: Secondary | ICD-10-CM | POA: Diagnosis not present

## 2022-05-02 DIAGNOSIS — M1711 Unilateral primary osteoarthritis, right knee: Secondary | ICD-10-CM | POA: Diagnosis not present

## 2022-05-02 DIAGNOSIS — M25561 Pain in right knee: Secondary | ICD-10-CM | POA: Diagnosis not present

## 2022-05-02 DIAGNOSIS — M25661 Stiffness of right knee, not elsewhere classified: Secondary | ICD-10-CM | POA: Diagnosis not present

## 2022-05-08 ENCOUNTER — Ambulatory Visit
Admission: RE | Admit: 2022-05-08 | Discharge: 2022-05-08 | Disposition: A | Discharge status: Home / Self Care | Payer: PPO | Source: Ambulatory Visit | Attending: Family Medicine | Admitting: Family Medicine

## 2022-05-08 DIAGNOSIS — Z1231 Encounter for screening mammogram for malignant neoplasm of breast: Secondary | ICD-10-CM

## 2022-05-09 DIAGNOSIS — M25661 Stiffness of right knee, not elsewhere classified: Secondary | ICD-10-CM | POA: Diagnosis not present

## 2022-05-09 DIAGNOSIS — M1711 Unilateral primary osteoarthritis, right knee: Secondary | ICD-10-CM | POA: Diagnosis not present

## 2022-05-09 DIAGNOSIS — M25561 Pain in right knee: Secondary | ICD-10-CM | POA: Diagnosis not present

## 2022-05-16 DIAGNOSIS — M25661 Stiffness of right knee, not elsewhere classified: Secondary | ICD-10-CM | POA: Diagnosis not present

## 2022-05-16 DIAGNOSIS — M1711 Unilateral primary osteoarthritis, right knee: Secondary | ICD-10-CM | POA: Diagnosis not present

## 2022-05-16 DIAGNOSIS — M25561 Pain in right knee: Secondary | ICD-10-CM | POA: Diagnosis not present

## 2022-05-23 DIAGNOSIS — M1711 Unilateral primary osteoarthritis, right knee: Secondary | ICD-10-CM | POA: Diagnosis not present

## 2022-05-23 DIAGNOSIS — M25561 Pain in right knee: Secondary | ICD-10-CM | POA: Diagnosis not present

## 2022-05-23 DIAGNOSIS — M25661 Stiffness of right knee, not elsewhere classified: Secondary | ICD-10-CM | POA: Diagnosis not present

## 2022-06-07 DIAGNOSIS — R300 Dysuria: Secondary | ICD-10-CM | POA: Diagnosis not present

## 2022-08-24 DIAGNOSIS — R300 Dysuria: Secondary | ICD-10-CM | POA: Diagnosis not present

## 2022-08-24 DIAGNOSIS — R311 Benign essential microscopic hematuria: Secondary | ICD-10-CM | POA: Diagnosis not present

## 2022-09-25 DIAGNOSIS — U071 COVID-19: Secondary | ICD-10-CM | POA: Diagnosis not present

## 2022-09-27 ENCOUNTER — Telehealth: Payer: Self-pay | Admitting: Gastroenterology

## 2022-09-27 NOTE — Telephone Encounter (Signed)
Inbound call from Marie [name] with [facility] requesting o/n for 02/19/22 as well as procedure report from 03/28/22 be faxed over to [phone]. Please advise.   Thank you

## 2022-09-28 NOTE — Telephone Encounter (Signed)
02/19/22 PV note and 03/2022 procedure report printed and faxed to Dr. Mayford Knife at [facility] medicine as requested.

## 2022-10-16 DIAGNOSIS — R102 Pelvic and perineal pain: Secondary | ICD-10-CM | POA: Diagnosis not present

## 2022-10-16 DIAGNOSIS — R311 Benign essential microscopic hematuria: Secondary | ICD-10-CM | POA: Diagnosis not present

## 2022-10-16 DIAGNOSIS — R3915 Urgency of urination: Secondary | ICD-10-CM | POA: Diagnosis not present

## 2022-11-26 DIAGNOSIS — N3946 Mixed incontinence: Secondary | ICD-10-CM | POA: Diagnosis not present

## 2022-11-26 DIAGNOSIS — M6289 Other specified disorders of muscle: Secondary | ICD-10-CM | POA: Diagnosis not present

## 2022-11-26 DIAGNOSIS — M6281 Muscle weakness (generalized): Secondary | ICD-10-CM | POA: Diagnosis not present

## 2022-12-06 ENCOUNTER — Emergency Department (HOSPITAL_COMMUNITY)
Admission: EM | Admit: 2022-12-06 | Discharge: 2022-12-07 | Disposition: A | Discharge status: Home / Self Care | Payer: PPO | Attending: Emergency Medicine | Admitting: Emergency Medicine

## 2022-12-06 ENCOUNTER — Other Ambulatory Visit: Payer: Self-pay

## 2022-12-06 ENCOUNTER — Emergency Department (HOSPITAL_COMMUNITY): Payer: PPO

## 2022-12-06 DIAGNOSIS — M546 Pain in thoracic spine: Secondary | ICD-10-CM | POA: Diagnosis not present

## 2022-12-06 DIAGNOSIS — M545 Low back pain, unspecified: Secondary | ICD-10-CM | POA: Diagnosis present

## 2022-12-06 DIAGNOSIS — Z91040 Latex allergy status: Secondary | ICD-10-CM | POA: Insufficient documentation

## 2022-12-06 DIAGNOSIS — Z7982 Long term (current) use of aspirin: Secondary | ICD-10-CM | POA: Insufficient documentation

## 2022-12-06 DIAGNOSIS — R079 Chest pain, unspecified: Secondary | ICD-10-CM | POA: Diagnosis not present

## 2022-12-06 NOTE — ED Triage Notes (Signed)
Patient reports mid back pain onset last week , denies injury or fall , ambulatory/respirations unlabored . Pain increases with movement / changing positions .

## 2022-12-07 MED ORDER — DICLOFENAC SODIUM 1 % EX GEL: 4.0000 g | Freq: Four times a day (QID) | CUTANEOUS | 0 refills | Status: DC

## 2022-12-07 MED ORDER — METHYLPREDNISOLONE 4 MG PO TBPK: ORAL_TABLET | ORAL | 0 refills | Status: DC

## 2022-12-07 MED ORDER — KETOROLAC TROMETHAMINE 15 MG/ML IJ SOLN
15.0000 mg | Freq: Once | INTRAMUSCULAR | Status: AC
  Administered 2022-12-07: 15 mg via INTRAMUSCULAR
  Filled 2022-12-07: qty 1

## 2022-12-07 NOTE — ED Provider Notes (Signed)
[place] EMERGENCY DEPARTMENT AT [facility] Provider Note   CSN: [id number] Arrival date & time: 12/06/22  2221     History  Chief Complaint  Patient presents with   Back Pain    Marie [name] is a 77 y.o. female.  77 yo F with a chief complaints of right-sided back pain.  This has been off and on for the past couple weeks.  Family said that she lifted a heavy thing of water prior to the onset.  She denies any other obvious injury.  Denies cough congestion or fever.  It seemed to go away and then reoccurred.  She was taking meloxicam but did not feel it was working.  Denies urinary symptoms denies abdominal pain.  Feels like it is on the right side of her thoracic back and radiates to her side.   Back Pain      Home Medications Prior to Admission medications   Medication Sig Start Date End Date Taking? Authorizing Provider  diclofenac Sodium (VOLTAREN) 1 % GEL Apply 4 g topically 4 (four) times daily. 12/07/22  Yes Melene Plan, DO  methylPREDNISolone (MEDROL DOSEPAK) 4 MG TBPK tablet Day 1: 8mg  before breakfast, 4 mg after lunch, 4 mg after supper, and 8 mg at bedtime Day 2: 4 mg before breakfast, 4 mg after lunch, 4 mg  after supper, and 8 mg  at bedtime Day 3:  4 mg  before breakfast, 4 mg  after lunch, 4 mg after supper, and 4 mg  at bedtime Day 4: 4 mg  before breakfast, 4 mg  after lunch, and 4 mg at bedtime Day 5: 4 mg  before breakfast and 4 mg at bedtime Day 6: 4 mg  before breakfast 12/07/22  Yes Melene Plan, DO  acetaminophen (TYLENOL) 500 MG tablet Take 500 mg by mouth every 6 (six) hours as needed.    [provider]  Ascorbic Acid (VITAMIN C PO) Take 1 tablet by mouth daily as needed (immune health). Patient not taking: Reported on 02/19/2022    [provider]  aspirin 81 MG tablet Take 81 mg by mouth daily.    [provider]  Cholecalciferol (VITAMIN D) 2000 units CAPS Take 2,000 Units by mouth daily.    [provider]  hydrochlorothiazide (HYDRODIURIL) 50 MG tablet Take 25-50 mg by mouth daily. May take an additional 25mg s for shortness of breath/ fluid    [provider]  ibuprofen (ADVIL,MOTRIN) 200 MG tablet Take 800 mg by mouth every 6 (six) hours as needed for moderate pain.    [provider]  Multiple Vitamin (MULTIVITAMIN WITH MINERALS) TABS tablet Take 1 tablet by mouth daily.    [provider]  potassium chloride (K-DUR) 10 MEQ tablet Take 10 mEq by mouth daily.    [provider]  Red Yeast Rice 600 MG TABS Take 1,200 mg by mouth daily.    [provider]      Allergies    Cholesterol, Hydrocodone, Lipitor  [atorvastatin calcium], Morphine and codeine, Sulfa antibiotics, Codeine, Latex, Naproxen, and Penicillins    Review of Systems   Review of Systems  Musculoskeletal:  Positive for back pain.    Physical Exam Updated Vital Signs BP (!) 159/85 (BP Location: Right Arm)   Pulse 73   Temp 97.7 F (36.5 C) (Oral)   Resp 18   SpO2 98%  Physical Exam Vitals and nursing note reviewed.  Constitutional:      General: She  is not in acute distress.    Appearance: She is well-developed. She is not diaphoretic.  HENT:     Head: Normocephalic and atraumatic.  Eyes:     Pupils: Pupils are equal, round, and reactive to light.  Cardiovascular:     Rate and Rhythm: Normal rate and regular rhythm.     Heart sounds: No murmur heard.    No friction rub. No gallop.  Pulmonary:     Effort: Pulmonary effort is normal.     Breath sounds: No wheezing or rales.  Abdominal:     General: There is no distension.     Palpations: Abdomen is soft.     Tenderness: There is no abdominal tenderness.  Musculoskeletal:        General: No tenderness.     Cervical back: Normal range of motion and neck supple.     Comments: Patient has a focal area of tenderness about the right paraspinal musculature of the area about T 8 and 9.  I do not appreciate send no  obvious rash.  Benign abdominal exam.  Skin:    General: Skin is warm and dry.  Neurological:     Mental Status: She is alert and oriented to person, place, and time.  Psychiatric:        Behavior: Behavior normal.     ED Results / Procedures / Treatments   Labs (all labs ordered are listed, but only abnormal results are displayed) Labs Reviewed - No data to display  EKG None  Radiology DG Thoracic Spine 2 View  Result Date: 12/06/2022 CLINICAL DATA:  Chest pain for 2 days, no known injury, initial encounter EXAM: THORACIC SPINE 2 VIEWS COMPARISON:  None Available. FINDINGS: Vertebral body height is well maintained. Mild osteophytic changes are seen. No paraspinal mass is noted. The pedicles are within normal limits. IMPRESSION: No acute abnormality noted. Electronically Signed   By: Alcide Clever M.D.   On: 12/06/2022 22:51    Procedures Procedures    Medications Ordered in ED Medications  ketorolac (TORADOL) 15 MG/ML injection 15 mg (has no administration in time range)    ED Course/ Medical Decision Making/ A&P                                 Medical Decision Making Amount and/or Complexity of Data Reviewed Radiology: ordered.  Risk Prescription drug management.   77 yo F with a chief complaints of right-sided thoracic back pain.  Most likely this is musculoskeletal.  She has a focal area of tenderness that reproduces her pain.  She had a plain film of the T-spine independently interpreted by me without obvious fracture.  Will treat supportively.  PCP follow-up.  12:21 AM:  I have discussed the diagnosis/risks/treatment options with the patient and family.  Evaluation and diagnostic testing in the emergency department does not suggest an emergent condition requiring admission or immediate intervention beyond what has been performed at this time.  They will follow up with PCP. We also discussed returning to the ED immediately if new or worsening sx occur. We discussed  the sx which are most concerning (e.g., sudden worsening pain, fever, inability to tolerate by mouth) that necessitate immediate return. Medications administered to the patient during their visit and any new prescriptions provided to the patient are listed below.  Medications given during this visit Medications  ketorolac (TORADOL) 15 MG/ML injection 15 mg (has no administration in  time range)     The patient appears reasonably screen and/or stabilized for discharge and I doubt any other medical condition or other [facility] requiring further screening, evaluation, or treatment in the ED at this time prior to discharge.          Final Clinical Impression(s) / ED Diagnoses Final diagnoses:  Acute right-sided thoracic back pain    Rx / DC Orders ED Discharge Orders          Ordered    methylPREDNISolone (MEDROL DOSEPAK) 4 MG TBPK tablet        12/07/22 0012    diclofenac Sodium (VOLTAREN) 1 % GEL  4 times daily        12/07/22 0012              Melene Plan, DO 12/07/22 0021

## 2022-12-07 NOTE — Discharge Instructions (Signed)
Take the steroids as prescribed.  Use the gel as prescribed. Also take tylenol 1000mg (2 extra strength) four times a day.   Please call your family doctor in the morning and let them know about your visit here.  They may want to see you in the office and see how you are doing.  If things are not improving they may want to refer you to physical therapy or perhaps try a different round of medications.

## 2022-12-25 DIAGNOSIS — H5213 Myopia, bilateral: Secondary | ICD-10-CM | POA: Diagnosis not present

## 2022-12-25 DIAGNOSIS — H25813 Combined forms of age-related cataract, bilateral: Secondary | ICD-10-CM | POA: Diagnosis not present

## 2023-01-03 DIAGNOSIS — Z23 Encounter for immunization: Secondary | ICD-10-CM | POA: Diagnosis not present

## 2023-01-03 DIAGNOSIS — M170 Bilateral primary osteoarthritis of knee: Secondary | ICD-10-CM | POA: Diagnosis not present

## 2023-01-03 DIAGNOSIS — M810 Age-related osteoporosis without current pathological fracture: Secondary | ICD-10-CM | POA: Diagnosis not present

## 2023-01-03 DIAGNOSIS — Z0000 Encounter for general adult medical examination without abnormal findings: Secondary | ICD-10-CM | POA: Diagnosis not present

## 2023-01-03 DIAGNOSIS — E7800 Pure hypercholesterolemia, unspecified: Secondary | ICD-10-CM | POA: Diagnosis not present

## 2023-01-03 DIAGNOSIS — I10 Essential (primary) hypertension: Secondary | ICD-10-CM | POA: Diagnosis not present

## 2023-04-09 ENCOUNTER — Other Ambulatory Visit: Payer: Self-pay | Admitting: Family Medicine

## 2023-04-09 DIAGNOSIS — Z0000 Encounter for general adult medical examination without abnormal findings: Secondary | ICD-10-CM

## 2023-05-13 ENCOUNTER — Ambulatory Visit
Admission: RE | Admit: 2023-05-13 | Discharge: 2023-05-13 | Disposition: A | Discharge status: Home / Self Care | Payer: PPO | Source: Ambulatory Visit | Attending: Family Medicine | Admitting: Family Medicine

## 2023-05-13 DIAGNOSIS — Z1231 Encounter for screening mammogram for malignant neoplasm of breast: Secondary | ICD-10-CM | POA: Diagnosis not present

## 2023-05-13 DIAGNOSIS — Z0000 Encounter for general adult medical examination without abnormal findings: Secondary | ICD-10-CM

## 2023-07-05 DIAGNOSIS — I10 Essential (primary) hypertension: Secondary | ICD-10-CM | POA: Diagnosis not present

## 2023-07-05 DIAGNOSIS — M170 Bilateral primary osteoarthritis of knee: Secondary | ICD-10-CM | POA: Diagnosis not present

## 2023-07-05 DIAGNOSIS — E7800 Pure hypercholesterolemia, unspecified: Secondary | ICD-10-CM | POA: Diagnosis not present

## 2023-07-24 DIAGNOSIS — M1711 Unilateral primary osteoarthritis, right knee: Secondary | ICD-10-CM | POA: Diagnosis not present

## 2023-09-20 NOTE — Patient Instructions (Addendum)
 SURGICAL WAITING ROOM VISITATION  Patients having surgery or a procedure may have no more than 2 support people in the waiting area - these visitors may rotate.    Children under the age of 30 must have an adult with them who is not the patient.  Visitors with respiratory illnesses are discouraged from visiting and should remain at home.  If the patient needs to stay at the hospital during part of their recovery, the visitor guidelines for inpatient rooms apply. Pre-op nurse will coordinate an appropriate time for 1 support person to accompany patient in pre-op.  This support person may not rotate.    Please refer to the [facility] website for the visitor guidelines for Inpatients (after your surgery is over and you are in a regular room).    Your procedure is scheduled on: 10/08/23   Report to [facility] Main Entrance    Report to admitting at 12:20 PM   Call this number if you have problems the morning of surgery [phone]   Do not eat food :After Midnight.   After Midnight you may have the following liquids until 11:50 AM DAY OF SURGERY  Water Non-Citrus Juices (without pulp, NO RED-Apple, White grape, White cranberry) Black Coffee (NO MILK/CREAM OR CREAMERS, sugar ok)  Clear Tea (NO MILK/CREAM OR CREAMERS, sugar ok) regular and decaf                             Plain Jell-O (NO RED)                                           Fruit ices (not with fruit pulp, NO RED)                                     Popsicles (NO RED)                                                               Sports drinks like Gatorade (NO RED)                 The day of surgery:  Drink ONE (1) Pre-Surgery Clear Ensure at 11:50 AM the morning of surgery. Drink in one sitting. Do not sip.  This drink was given to you during your hospital  pre-op appointment visit. Nothing else to drink after completing the  Pre-Surgery Clear Ensure.          If you have questions, please contact your  surgeon's office.   FOLLOW BOWEL PREP AND ANY ADDITIONAL PRE OP INSTRUCTIONS YOU RECEIVED FROM YOUR SURGEON'S OFFICE!!!     Oral Hygiene is also important to reduce your risk of infection.                                    Remember - BRUSH YOUR TEETH THE MORNING OF SURGERY WITH YOUR REGULAR TOOTHPASTE  DENTURES WILL BE REMOVED PRIOR TO SURGERY PLEASE DO NOT APPLY Poly grip OR  ADHESIVES!!!   Stop all vitamins and herbal supplements 7 days before surgery.   Take these medicines the morning of surgery with A SIP OF WATER: Tylenol               You may not have any metal on your body including hair pins, jewelry, and body piercing             Do not wear make-up, lotions, powders, perfumes, or deodorant  Do not wear nail polish including gel and S&S, artificial/acrylic nails, or any other type of covering on natural nails including finger and toenails. If you have artificial nails, gel coating, etc. that needs to be removed by a nail salon please have this removed prior to surgery or surgery may need to be canceled/ delayed if the surgeon/ anesthesia feels like they are unable to be safely monitored.   Do not shave  48 hours prior to surgery.    Do not bring valuables to the hospital. [facility] IS NOT             RESPONSIBLE   FOR VALUABLES.   Contacts, glasses, dentures or bridgework may not be worn into surgery.   Bring small overnight bag day of surgery.   DO NOT BRING YOUR HOME MEDICATIONS TO THE HOSPITAL. PHARMACY WILL DISPENSE MEDICATIONS LISTED ON YOUR MEDICATION LIST TO YOU DURING YOUR ADMISSION IN THE HOSPITAL!              Please read over the following fact sheets you were given: IF YOU HAVE QUESTIONS ABOUT YOUR PRE-OP INSTRUCTIONS PLEASE CALL [phone].     Pre-operative 5 CHG Bath Instructions   You can play a key role in reducing the risk of infection after surgery. Your skin needs to be as free of germs as possible. You can reduce the number of germs  on your skin by washing with CHG (chlorhexidine gluconate) soap before surgery. CHG is an antiseptic soap that kills germs and continues to kill germs even after washing.   DO NOT use if you have an allergy to chlorhexidine/CHG or antibacterial soaps. If your skin becomes reddened or irritated, stop using the CHG and notify one of our RNs at [phone].   Please shower with the CHG soap starting 4 days before surgery using the following schedule:     Please keep in mind the following:  DO NOT shave, including legs and underarms, starting the day of your first shower.   You may shave your face at any point before/day of surgery.  Place clean sheets on your bed the day you start using CHG soap. Use a clean washcloth (not used since being washed) for each shower. DO NOT sleep with pets once you start using the CHG.   CHG Shower Instructions:  If you choose to wash your hair and private area, wash first with your normal shampoo/soap.  After you use shampoo/soap, rinse your hair and body thoroughly to remove shampoo/soap residue.  Turn the water OFF and apply about 3 tablespoons (45 ml) of CHG soap to a CLEAN washcloth.  Apply CHG soap ONLY FROM YOUR NECK DOWN TO YOUR TOES (washing for 3-5 minutes)  DO NOT use CHG soap on face, private areas, open wounds, or sores.  Pay special attention to the area where your surgery is being performed.  If you are having back surgery, having someone wash your back for you may be helpful. Wait 2 minutes after CHG soap is applied, then you may  rinse off the CHG soap.  Pat dry with a clean towel  Put on clean clothes/pajamas   If you choose to wear lotion, please use ONLY the CHG-compatible lotions on the back of this paper.     Additional instructions for the day of surgery: DO NOT APPLY any lotions, deodorants, cologne, or perfumes.   Put on clean/comfortable clothes.  Brush your teeth.  Ask your nurse before applying any prescription medications to the  skin.      CHG Compatible Lotions   Aveeno Moisturizing lotion  Cetaphil Moisturizing Cream  Cetaphil Moisturizing Lotion  Clairol Herbal Essence Moisturizing Lotion, Dry Skin  Clairol Herbal Essence Moisturizing Lotion, Extra Dry Skin  Clairol Herbal Essence Moisturizing Lotion, Normal Skin  Curel Age Defying Therapeutic Moisturizing Lotion with Alpha Hydroxy  Curel Extreme Care Body Lotion  Curel Soothing Hands Moisturizing Hand Lotion  Curel Therapeutic Moisturizing Cream, Fragrance-Free  Curel Therapeutic Moisturizing Lotion, Fragrance-Free  Curel Therapeutic Moisturizing Lotion, Original Formula  Eucerin Daily Replenishing Lotion  Eucerin Dry Skin Therapy Plus Alpha Hydroxy Crme  Eucerin Dry Skin Therapy Plus Alpha Hydroxy Lotion  Eucerin Original Crme  Eucerin Original Lotion  Eucerin Plus Crme Eucerin Plus Lotion  Eucerin TriLipid Replenishing Lotion  Keri Anti-Bacterial Hand Lotion  Keri Deep Conditioning Original Lotion Dry Skin Formula Softly Scented  Keri Deep Conditioning Original Lotion, Fragrance Free Sensitive Skin Formula  Keri Lotion Fast Absorbing Fragrance Free Sensitive Skin Formula  Keri Lotion Fast Absorbing Softly Scented Dry Skin Formula  Keri Original Lotion  Keri Skin Renewal Lotion Keri Silky Smooth Lotion  Keri Silky Smooth Sensitive Skin Lotion  Nivea Body Creamy Conditioning Oil  Nivea Body Extra Enriched Lotion  Nivea Body Original Lotion  Nivea Body Sheer Moisturizing Lotion Nivea Crme  Nivea Skin Firming Lotion  NutraDerm 30 Skin Lotion  NutraDerm Skin Lotion  NutraDerm Therapeutic Skin Cream  NutraDerm Therapeutic Skin Lotion  ProShield Protective Hand Cream  Provon moisturizing lotion    View Pre-Surgery Education Videos:  [email or web address]    ________________________________________________________________________  [employer]  An incentive spirometer  is a tool that can help keep your lungs clear and active. This tool measures how well you are filling your lungs with each breath. Taking long deep breaths may help reverse or decrease the chance of developing breathing (pulmonary) problems (especially infection) following: A long period of time when you are unable to move or be active. BEFORE THE PROCEDURE  If the spirometer includes an indicator to show your best effort, your nurse or respiratory therapist will set it to a desired goal. If possible, sit up straight or lean slightly forward. Try not to slouch. Hold the incentive spirometer in an upright position. INSTRUCTIONS FOR USE  Sit on the edge of your bed if possible, or sit up as far as you can in bed or on a chair. Hold the incentive spirometer in an upright position. Breathe out normally. Place the mouthpiece in your mouth and seal your lips tightly around it. Breathe in slowly and as deeply as possible, raising the piston or the ball toward the top of the column. Hold your breath for 3-5 seconds or for as long as possible. Allow the piston or ball to fall to the bottom of the column. Remove the mouthpiece from your mouth and breathe out normally. Rest for a few seconds and repeat Steps 1 through 7 at least 10 times every 1-2 hours when you are awake. Take your time and take a few normal breaths  between deep breaths. The spirometer may include an indicator to show your best effort. Use the indicator as a goal to work toward during each repetition. After each set of 10 deep breaths, practice coughing to be sure your lungs are clear. If you have an incision (the cut made at the time of surgery), support your incision when coughing by placing a pillow or rolled up towels firmly against it. Once you are able to get out of bed, walk around indoors and cough well. You may stop using the incentive spirometer when instructed by your caregiver.  RISKS AND COMPLICATIONS Take your time so you do  not get dizzy or light-headed. If you are in pain, you may need to take or ask for pain medication before doing incentive spirometry. It is harder to take a deep breath if you are having pain. AFTER USE Rest and breathe slowly and easily. It can be helpful to keep track of a log of your progress. Your caregiver can provide you with a simple table to help with this. If you are using the spirometer at home, follow these instructions: SEEK MEDICAL CARE IF:  You are having difficultly using the spirometer. You have trouble using the spirometer as often as instructed. Your pain medication is not giving enough relief while using the spirometer. You develop fever of 100.5 F (38.1 C) or higher. SEEK IMMEDIATE MEDICAL CARE IF:  You cough up bloody sputum that had not been present before. You develop fever of 102 F (38.9 C) or greater. You develop worsening pain at or near the incision site. MAKE SURE YOU:  Understand these instructions. Will watch your condition. Will get help right away if you are not doing well or get worse. Document Released: 07/02/2006 Document Revised: 05/14/2011 Document Reviewed: 09/02/2006 [facility] Patient Information 2014 [place], [place].   ________________________________________________________________________

## 2023-09-20 NOTE — Progress Notes (Addendum)
 COVID Vaccine Completed: yes  Date of COVID positive in last 90 days:  PCP - Elsie Lesches, MD Cardiologist - Dr. Ladona- cardiac cath x2, no f/u needed  Chest x-ray - n/a EKG - 09/26/23 Epic/chart Stress Test - yes with cards years ago per pt ECHO - n/a Cardiac Cath - x2- no stents Pacemaker/ICD device last checked: n/a Spinal Cord Stimulator: n/a  Bowel Prep - no  Sleep Study - yes, negative CPAP -   Fasting Blood Sugar - n/a Checks Blood Sugar _____ times a day  Last dose of GLP1 agonist-  N/A GLP1 instructions:  Do not take after     Last dose of SGLT-2 inhibitors-  N/A SGLT-2 instructions:  Do not take after     Blood Thinner Instructions:  Last dose:   Time: Aspirin Instructions: ASA 81, hold 5-7 days Last Dose:  Activity level: Can go up a flight of stairs and perform activities of daily living without stopping and without symptoms of chest pain. SOB on exertion, not new per pt. Ambulating with cane when out.  Anesthesia review:   Patient denies shortness of breath, fever, cough and chest pain at PAT appointment  Patient verbalized understanding of instructions that were given to them at the PAT appointment. Patient was also instructed that they will need to review over the PAT instructions again at home before surgery.

## 2023-09-26 ENCOUNTER — Encounter (HOSPITAL_COMMUNITY)
Admission: RE | Admit: 2023-09-26 | Discharge: 2023-09-26 | Disposition: A | Discharge status: Home / Self Care | Source: Ambulatory Visit | Attending: Orthopedic Surgery | Admitting: Orthopedic Surgery

## 2023-09-26 ENCOUNTER — Other Ambulatory Visit: Payer: Self-pay

## 2023-09-26 ENCOUNTER — Encounter (HOSPITAL_COMMUNITY): Payer: Self-pay

## 2023-09-26 VITALS — BP 141/80 | HR 64 | Temp 98.2°F | Resp 16 | Ht 66.0 in | Wt 216.0 lb

## 2023-09-26 DIAGNOSIS — Z01818 Encounter for other preprocedural examination: Secondary | ICD-10-CM | POA: Insufficient documentation

## 2023-09-26 DIAGNOSIS — I4510 Unspecified right bundle-branch block: Secondary | ICD-10-CM | POA: Insufficient documentation

## 2023-09-26 DIAGNOSIS — Z01810 Encounter for preprocedural cardiovascular examination: Secondary | ICD-10-CM | POA: Diagnosis present

## 2023-09-26 DIAGNOSIS — M25561 Pain in right knee: Secondary | ICD-10-CM | POA: Diagnosis not present

## 2023-09-26 DIAGNOSIS — Z01812 Encounter for preprocedural laboratory examination: Secondary | ICD-10-CM | POA: Diagnosis present

## 2023-09-26 DIAGNOSIS — I10 Essential (primary) hypertension: Secondary | ICD-10-CM | POA: Diagnosis not present

## 2023-09-26 HISTORY — DX: Other specified postprocedural states: Z98.890

## 2023-09-26 HISTORY — DX: Peripheral vascular disease, unspecified: I73.9

## 2023-09-26 LAB — CBC
HCT: 43.8 % (ref 36.0–46.0)
Hemoglobin: 14.3 g/dL (ref 12.0–15.0)
MCH: 29.5 pg (ref 26.0–34.0)
MCHC: 32.6 g/dL (ref 30.0–36.0)
MCV: 90.3 fL (ref 80.0–100.0)
Platelets: 245 K/uL (ref 150–400)
RBC: 4.85 MIL/uL (ref 3.87–5.11)
RDW: 12.7 % (ref 11.5–15.5)
WBC: 5.9 K/uL (ref 4.0–10.5)
nRBC: 0 % (ref 0.0–0.2)

## 2023-09-26 LAB — BASIC METABOLIC PANEL WITH GFR
Anion gap: 10 (ref 5–15)
BUN: 9 mg/dL (ref 8–23)
CO2: 27 mmol/L (ref 22–32)
Calcium: 9.4 mg/dL (ref 8.9–10.3)
Chloride: 102 mmol/L (ref 98–111)
Creatinine, Ser: 0.67 mg/dL (ref 0.44–1.00)
GFR, Estimated: >60 mL/min (ref >60)
Glucose, Bld: 92 mg/dL (ref 70–99)
Potassium: 3.5 mmol/L (ref 3.5–5.1)
Sodium: 139 mmol/L (ref 135–145)

## 2023-09-26 LAB — SURGICAL PCR SCREEN
MRSA, PCR: NEGATIVE
Staphylococcus aureus: NEGATIVE

## 2023-10-08 ENCOUNTER — Ambulatory Visit (HOSPITAL_BASED_OUTPATIENT_CLINIC_OR_DEPARTMENT_OTHER): Payer: Self-pay | Admitting: Anesthesiology

## 2023-10-08 ENCOUNTER — Encounter (HOSPITAL_COMMUNITY): Payer: Self-pay | Admitting: Orthopedic Surgery

## 2023-10-08 ENCOUNTER — Observation Stay (HOSPITAL_COMMUNITY)
Admission: RE | Admit: 2023-10-08 | Discharge: 2023-10-10 | Disposition: A | Discharge status: Home / Self Care | Attending: Orthopedic Surgery | Admitting: Orthopedic Surgery

## 2023-10-08 ENCOUNTER — Ambulatory Visit (HOSPITAL_COMMUNITY): Payer: Self-pay | Admitting: Anesthesiology

## 2023-10-08 ENCOUNTER — Other Ambulatory Visit: Payer: Self-pay

## 2023-10-08 ENCOUNTER — Encounter (HOSPITAL_COMMUNITY)
Admission: RE | Disposition: A | Discharge status: Home / Self Care | Payer: Self-pay | Source: Home / Self Care | Attending: Orthopedic Surgery

## 2023-10-08 DIAGNOSIS — Z7982 Long term (current) use of aspirin: Secondary | ICD-10-CM | POA: Insufficient documentation

## 2023-10-08 DIAGNOSIS — Z79899 Other long term (current) drug therapy: Secondary | ICD-10-CM | POA: Insufficient documentation

## 2023-10-08 DIAGNOSIS — M25561 Pain in right knee: Secondary | ICD-10-CM | POA: Diagnosis present

## 2023-10-08 DIAGNOSIS — I739 Peripheral vascular disease, unspecified: Secondary | ICD-10-CM

## 2023-10-08 DIAGNOSIS — Z880 Allergy status to penicillin: Secondary | ICD-10-CM | POA: Insufficient documentation

## 2023-10-08 DIAGNOSIS — M1711 Unilateral primary osteoarthritis, right knee: Secondary | ICD-10-CM

## 2023-10-08 DIAGNOSIS — I10 Essential (primary) hypertension: Secondary | ICD-10-CM | POA: Diagnosis not present

## 2023-10-08 DIAGNOSIS — G8918 Other acute postprocedural pain: Secondary | ICD-10-CM | POA: Diagnosis not present

## 2023-10-08 DIAGNOSIS — Z87891 Personal history of nicotine dependence: Secondary | ICD-10-CM

## 2023-10-08 DIAGNOSIS — Z96651 Presence of right artificial knee joint: Principal | ICD-10-CM

## 2023-10-08 HISTORY — PX: TOTAL KNEE ARTHROPLASTY: SHX125

## 2023-10-08 SURGERY — ARTHROPLASTY, KNEE, TOTAL
Anesthesia: Monitor Anesthesia Care | Site: Knee | Laterality: Right

## 2023-10-08 MED ORDER — ONDANSETRON HCL 4 MG/2ML IJ SOLN: 4.0000 mg | Freq: Once | INTRAMUSCULAR | Status: DC | PRN

## 2023-10-08 MED ORDER — METHOCARBAMOL 1000 MG/10ML IJ SOLN: 500.0000 mg | Freq: Four times a day (QID) | INTRAMUSCULAR | Status: DC | PRN

## 2023-10-08 MED ORDER — TRANEXAMIC ACID-NACL 1000-0.7 MG/100ML-% IV SOLN
1000.0000 mg | INTRAVENOUS | Status: AC
  Administered 2023-10-08: 1000 mg via INTRAVENOUS
  Filled 2023-10-08: qty 100

## 2023-10-08 MED ORDER — PROPOFOL 500 MG/50ML IV EMUL
INTRAVENOUS | Status: DC | PRN
  Administered 2023-10-08: 75 ug/kg/min via INTRAVENOUS
  Administered 2023-10-08: 40 mg via INTRAVENOUS

## 2023-10-08 MED ORDER — PROPOFOL 1000 MG/100ML IV EMUL
INTRAVENOUS | Status: AC
  Filled 2023-10-08: qty 100

## 2023-10-08 MED ORDER — ACETAMINOPHEN 10 MG/ML IV SOLN
INTRAVENOUS | Status: DC | PRN
  Administered 2023-10-08: 1000 mg via INTRAVENOUS

## 2023-10-08 MED ORDER — OXYCODONE HCL 5 MG PO TABS
5.0000 mg | ORAL_TABLET | ORAL | Status: DC | PRN
  Administered 2023-10-08: 5 mg via ORAL
  Administered 2023-10-09 – 2023-10-10 (×2): 10 mg via ORAL
  Filled 2023-10-08: qty 1
  Filled 2023-10-08 (×4): qty 2

## 2023-10-08 MED ORDER — ASPIRIN 81 MG PO CHEW
81.0000 mg | CHEWABLE_TABLET | Freq: Two times a day (BID) | ORAL | Status: DC
  Administered 2023-10-08 – 2023-10-10 (×4): 81 mg via ORAL
  Filled 2023-10-08 (×4): qty 1

## 2023-10-08 MED ORDER — METOCLOPRAMIDE HCL 5 MG PO TABS: 5.0000 mg | ORAL_TABLET | Freq: Three times a day (TID) | ORAL | Status: DC | PRN

## 2023-10-08 MED ORDER — 0.9 % SODIUM CHLORIDE (POUR BTL) OPTIME
TOPICAL | Status: DC | PRN
  Administered 2023-10-08: 1000 mL

## 2023-10-08 MED ORDER — ONDANSETRON HCL 4 MG/2ML IJ SOLN
INTRAMUSCULAR | Status: AC
  Filled 2023-10-08: qty 2

## 2023-10-08 MED ORDER — CEFAZOLIN SODIUM-DEXTROSE 2-4 GM/100ML-% IV SOLN
2.0000 g | Freq: Four times a day (QID) | INTRAVENOUS | Status: AC
  Administered 2023-10-08 – 2023-10-09 (×2): 2 g via INTRAVENOUS
  Filled 2023-10-08 (×2): qty 100

## 2023-10-08 MED ORDER — METHOCARBAMOL 500 MG PO TABS
500.0000 mg | ORAL_TABLET | Freq: Four times a day (QID) | ORAL | Status: DC | PRN
  Administered 2023-10-09: 500 mg via ORAL
  Filled 2023-10-08: qty 1

## 2023-10-08 MED ORDER — POTASSIUM CHLORIDE CRYS ER 10 MEQ PO TBCR
10.0000 meq | EXTENDED_RELEASE_TABLET | Freq: Every day | ORAL | Status: DC
  Administered 2023-10-09 – 2023-10-10 (×2): 10 meq via ORAL
  Filled 2023-10-08 (×2): qty 1

## 2023-10-08 MED ORDER — SODIUM CHLORIDE 0.9 % IV SOLN: INTRAVENOUS | Status: DC

## 2023-10-08 MED ORDER — SENNA 8.6 MG PO TABS
2.0000 | ORAL_TABLET | Freq: Every day | ORAL | Status: DC
  Administered 2023-10-08: 17.2 mg via ORAL
  Filled 2023-10-08 (×2): qty 2

## 2023-10-08 MED ORDER — HYDROMORPHONE HCL 1 MG/ML IJ SOLN: 0.5000 mg | INTRAMUSCULAR | Status: DC | PRN

## 2023-10-08 MED ORDER — POVIDONE-IODINE 10 % EX SWAB: 2.0000 | Freq: Once | CUTANEOUS | Status: DC

## 2023-10-08 MED ORDER — PHENYLEPHRINE HCL-NACL 20-0.9 MG/250ML-% IV SOLN
INTRAVENOUS | Status: AC
  Filled 2023-10-08: qty 250

## 2023-10-08 MED ORDER — POLYETHYLENE GLYCOL 3350 17 G PO PACK
17.0000 g | PACK | Freq: Two times a day (BID) | ORAL | Status: DC
  Administered 2023-10-08 – 2023-10-10 (×4): 17 g via ORAL
  Filled 2023-10-08 (×4): qty 1

## 2023-10-08 MED ORDER — BUPIVACAINE-EPINEPHRINE (PF) 0.5% -1:200000 IJ SOLN
INTRAMUSCULAR | Status: DC | PRN
  Administered 2023-10-08: 20 mL via PERINEURAL

## 2023-10-08 MED ORDER — DIPHENHYDRAMINE HCL 12.5 MG/5ML PO ELIX: 12.5000 mg | ORAL_SOLUTION | ORAL | Status: DC | PRN

## 2023-10-08 MED ORDER — DEXAMETHASONE SODIUM PHOSPHATE 10 MG/ML IJ SOLN
10.0000 mg | Freq: Once | INTRAMUSCULAR | Status: AC
  Administered 2023-10-09: 10 mg via INTRAVENOUS
  Filled 2023-10-08: qty 1

## 2023-10-08 MED ORDER — MENTHOL 3 MG MT LOZG: 1.0000 | LOZENGE | OROMUCOSAL | Status: DC | PRN

## 2023-10-08 MED ORDER — KETOROLAC TROMETHAMINE 30 MG/ML IJ SOLN
INTRAMUSCULAR | Status: AC
  Filled 2023-10-08: qty 1

## 2023-10-08 MED ORDER — DEXAMETHASONE SODIUM PHOSPHATE 10 MG/ML IJ SOLN
8.0000 mg | Freq: Once | INTRAMUSCULAR | Status: AC
  Administered 2023-10-08: 8 mg via INTRAVENOUS

## 2023-10-08 MED ORDER — ACETAMINOPHEN 500 MG PO TABS
1000.0000 mg | ORAL_TABLET | Freq: Four times a day (QID) | ORAL | Status: DC
  Administered 2023-10-08 – 2023-10-10 (×5): 1000 mg via ORAL
  Filled 2023-10-08 (×6): qty 2

## 2023-10-08 MED ORDER — BUPIVACAINE IN DEXTROSE 0.75-8.25 % IT SOLN
INTRATHECAL | Status: DC | PRN
  Administered 2023-10-08: 1.6 mL via INTRATHECAL

## 2023-10-08 MED ORDER — CEFAZOLIN SODIUM-DEXTROSE 2-4 GM/100ML-% IV SOLN
2.0000 g | INTRAVENOUS | Status: AC
  Administered 2023-10-08: 2 g via INTRAVENOUS
  Filled 2023-10-08: qty 100

## 2023-10-08 MED ORDER — PHENYLEPHRINE HCL-NACL 20-0.9 MG/250ML-% IV SOLN
INTRAVENOUS | Status: DC | PRN
  Administered 2023-10-08: 50 ug/min via INTRAVENOUS

## 2023-10-08 MED ORDER — STERILE WATER FOR IRRIGATION IR SOLN
Status: DC | PRN
  Administered 2023-10-08: 2000 mL

## 2023-10-08 MED ORDER — OXYCODONE HCL 5 MG PO TABS
10.0000 mg | ORAL_TABLET | ORAL | Status: DC | PRN
  Administered 2023-10-09 (×2): 10 mg via ORAL

## 2023-10-08 MED ORDER — ONDANSETRON HCL 4 MG/2ML IJ SOLN
INTRAMUSCULAR | Status: DC | PRN
  Administered 2023-10-08: 4 mg via INTRAVENOUS

## 2023-10-08 MED ORDER — ORAL CARE MOUTH RINSE: 15.0000 mL | Freq: Once | OROMUCOSAL | Status: AC

## 2023-10-08 MED ORDER — SODIUM CHLORIDE (PF) 0.9 % IJ SOLN
INTRAMUSCULAR | Status: AC
  Filled 2023-10-08: qty 30

## 2023-10-08 MED ORDER — SODIUM CHLORIDE 0.9 % IR SOLN
Status: DC | PRN
  Administered 2023-10-08: 1000 mL

## 2023-10-08 MED ORDER — HYDROCHLOROTHIAZIDE 25 MG PO TABS
25.0000 mg | ORAL_TABLET | Freq: Every day | ORAL | Status: DC
  Administered 2023-10-09 – 2023-10-10 (×2): 25 mg via ORAL
  Filled 2023-10-08 (×2): qty 1

## 2023-10-08 MED ORDER — TRANEXAMIC ACID-NACL 1000-0.7 MG/100ML-% IV SOLN
1000.0000 mg | Freq: Once | INTRAVENOUS | Status: AC
  Administered 2023-10-08: 1000 mg via INTRAVENOUS
  Filled 2023-10-08: qty 100

## 2023-10-08 MED ORDER — ONDANSETRON HCL 4 MG/2ML IJ SOLN: 4.0000 mg | Freq: Four times a day (QID) | INTRAMUSCULAR | Status: DC | PRN

## 2023-10-08 MED ORDER — ALUM & MAG HYDROXIDE-SIMETH 200-200-20 MG/5ML PO SUSP: 30.0000 mL | ORAL | Status: DC | PRN

## 2023-10-08 MED ORDER — ONDANSETRON HCL 4 MG PO TABS: 4.0000 mg | ORAL_TABLET | Freq: Four times a day (QID) | ORAL | Status: DC | PRN

## 2023-10-08 MED ORDER — FENTANYL CITRATE PF 50 MCG/ML IJ SOSY: 25.0000 ug | PREFILLED_SYRINGE | INTRAMUSCULAR | Status: DC | PRN

## 2023-10-08 MED ORDER — DEXAMETHASONE SODIUM PHOSPHATE 10 MG/ML IJ SOLN
INTRAMUSCULAR | Status: AC
  Filled 2023-10-08: qty 1

## 2023-10-08 MED ORDER — OXYCODONE HCL 5 MG/5ML PO SOLN: 5.0000 mg | Freq: Once | ORAL | Status: DC | PRN

## 2023-10-08 MED ORDER — LACTATED RINGERS IV SOLN: INTRAVENOUS | Status: DC

## 2023-10-08 MED ORDER — CHLORHEXIDINE GLUCONATE 0.12 % MT SOLN
15.0000 mL | Freq: Once | OROMUCOSAL | Status: AC
  Administered 2023-10-08: 15 mL via OROMUCOSAL

## 2023-10-08 MED ORDER — METOCLOPRAMIDE HCL 5 MG/ML IJ SOLN: 5.0000 mg | Freq: Three times a day (TID) | INTRAMUSCULAR | Status: DC | PRN

## 2023-10-08 MED ORDER — BISACODYL 10 MG RE SUPP: 10.0000 mg | Freq: Every day | RECTAL | Status: DC | PRN

## 2023-10-08 MED ORDER — BUPIVACAINE-EPINEPHRINE (PF) 0.25% -1:200000 IJ SOLN
INTRAMUSCULAR | Status: AC
  Filled 2023-10-08: qty 30

## 2023-10-08 MED ORDER — FENTANYL CITRATE PF 50 MCG/ML IJ SOSY
50.0000 ug | PREFILLED_SYRINGE | INTRAMUSCULAR | Status: AC
  Administered 2023-10-08: 100 ug via INTRAVENOUS
  Filled 2023-10-08: qty 2

## 2023-10-08 MED ORDER — MEPERIDINE HCL 25 MG/ML IJ SOLN: 6.2500 mg | INTRAMUSCULAR | Status: DC | PRN

## 2023-10-08 MED ORDER — OXYCODONE HCL 5 MG PO TABS: 5.0000 mg | ORAL_TABLET | Freq: Once | ORAL | Status: DC | PRN

## 2023-10-08 MED ORDER — SODIUM CHLORIDE (PF) 0.9 % IJ SOLN
INTRAMUSCULAR | Status: DC | PRN
  Administered 2023-10-08: 61 mL

## 2023-10-08 MED ORDER — PHENOL 1.4 % MT LIQD: 1.0000 | OROMUCOSAL | Status: DC | PRN

## 2023-10-08 SURGICAL SUPPLY — 46 items
BAG COUNTER SPONGE SURGICOUNT (BAG) IMPLANT
BAG ZIPLOCK 12X15 (MISCELLANEOUS) ×2 IMPLANT
BLADE SAW SGTL 13.0X1.19X90.0M (BLADE) ×2 IMPLANT
BNDG ELASTIC 6INX 5YD STR LF (GAUZE/BANDAGES/DRESSINGS) ×2 IMPLANT
BNDG ELASTIC 6X10 VLCR STRL LF (GAUZE/BANDAGES/DRESSINGS) IMPLANT
BOWL SMART MIX CTS (DISPOSABLE) ×2 IMPLANT
CEMENT HV SMART SET (Cement) ×4 IMPLANT
COMP FEM CMT KNEE NRW 5 RT (Joint) IMPLANT
COVER SURGICAL LIGHT HANDLE (MISCELLANEOUS) ×2 IMPLANT
CUFF TRNQT CYL 34X4.125X (TOURNIQUET CUFF) ×2 IMPLANT
DERMABOND ADVANCED .7 DNX12 (GAUZE/BANDAGES/DRESSINGS) ×2 IMPLANT
DRAPE U-SHAPE 47X51 STRL (DRAPES) ×2 IMPLANT
DRESSING AQUACEL AG SP 3.5X10 (GAUZE/BANDAGES/DRESSINGS) ×2 IMPLANT
DURAPREP 26ML APPLICATOR (WOUND CARE) ×4 IMPLANT
ELECT REM PT RETURN 15FT ADLT (MISCELLANEOUS) ×2 IMPLANT
GLOVE BIO SURGEON STRL SZ 6 (GLOVE) ×2 IMPLANT
GLOVE BIOGEL PI IND STRL 6.5 (GLOVE) ×2 IMPLANT
GLOVE BIOGEL PI IND STRL 7.5 (GLOVE) ×2 IMPLANT
GLOVE ORTHO TXT STRL SZ7.5 (GLOVE) ×4 IMPLANT
GOWN STRL REUS W/ TWL LRG LVL3 (GOWN DISPOSABLE) ×4 IMPLANT
HOLDER FOLEY CATH W/STRAP (MISCELLANEOUS) IMPLANT
INSERT TIB ASF 11 4-5/CD RT (Insert) IMPLANT
KIT TURNOVER KIT A (KITS) ×2 IMPLANT
MANIFOLD NEPTUNE II (INSTRUMENTS) ×2 IMPLANT
NDL SAFETY ECLIPSE 18X1.5 (NEEDLE) IMPLANT
NS IRRIG 1000ML POUR BTL (IV SOLUTION) ×2 IMPLANT
PACK TOTAL KNEE CUSTOM (KITS) ×2 IMPLANT
PENCIL SMOKE EVACUATOR (MISCELLANEOUS) ×2 IMPLANT
PIN DRILL HDLS TROCAR 75 4PK (PIN) IMPLANT
PROTECTOR NERVE ULNAR (MISCELLANEOUS) ×2 IMPLANT
SCREW FEMALE HEX FIX 25X2.5 (ORTHOPEDIC DISPOSABLE SUPPLIES) IMPLANT
SET HNDPC FAN SPRY TIP SCT (DISPOSABLE) ×2 IMPLANT
SET PAD KNEE POSITIONER (MISCELLANEOUS) ×2 IMPLANT
SPIKE FLUID TRANSFER (MISCELLANEOUS) ×4 IMPLANT
STEM POLY PAT PLY 35M KNEE (Knees) IMPLANT
STEM TIBIA 5 DEG SZ D R KNEE (Knees) IMPLANT
SUT MNCRL AB 4-0 PS2 18 (SUTURE) ×2 IMPLANT
SUT STRATAFIX PDS+ 0 24IN (SUTURE) ×2 IMPLANT
SUT VIC AB 1 CT1 36 (SUTURE) ×2 IMPLANT
SUT VIC AB 2-0 CT1 TAPERPNT 27 (SUTURE) ×4 IMPLANT
SYR 3ML LL SCALE MARK (SYRINGE) ×2 IMPLANT
TOWEL GREEN STERILE FF (TOWEL DISPOSABLE) ×2 IMPLANT
TRAY FOL W/BAG SLVR 16FR STRL (SET/KITS/TRAYS/PACK) IMPLANT
TUBE SUCTION HIGH CAP CLEAR NV (SUCTIONS) ×2 IMPLANT
WATER STERILE IRR 1000ML POUR (IV SOLUTION) ×4 IMPLANT
WRAP KNEE MAXI GEL POST OP (GAUZE/BANDAGES/DRESSINGS) ×2 IMPLANT

## 2023-10-08 NOTE — H&P (Signed)
 TOTAL KNEE ADMISSION H&P  Patient is being admitted for right total knee arthroplasty.  Therapy Plans: outpatient therapy at [facility] Disposition: Home with friend Planned DVT Prophylaxis: aspirin  81mg  BID DME needed: none PCP: Dr. Arloa - clearance received TXA: IV Allergies: hydrocodone - hives, aleve - hives (tolerates ibuprofen ), sulfa - rash, PCN, morphine Anesthesia Concerns: vomiting** BMI: 34.7 Last HgbA1c: none   Other: - [facility] to deliver meds for her - Friend home for first few days / sister to help drive her to PT - will borrow CTU, can get ice wrap at hospital - No hx of VTE or cancer - oxycodone , tylenol , ibuprofen , robaxin   Vomited a lot after bladder surgery >> needs to stay overnight  Non-nickel knee ? - reports infections in her ears, rashes on her fingers, etc   Subjective:  Chief Complaint:right knee pain.  HPI: Marie [name], 78 y.o. female, has a history of pain and functional disability in the right knee due to arthritis and has failed non-surgical conservative treatments for greater than 12 weeks to includeNSAID's and/or analgesics, corticosteriod injections, and activity modification.  Onset of symptoms was gradual, starting 2 years ago with gradually worsening course since that time. The patient noted no past surgery on the right knee(s).  Patient currently rates pain in the right knee(s) at 7 out of 10 with activity. Patient has worsening of pain with activity and weight bearing and pain that interferes with activities of daily living.  Patient has evidence of joint space narrowing by imaging studies. There is no active infection.  Patient Active Problem List   Diagnosis Date Noted   Varicose veins of right lower extremity with complications 09/19/2016   Past Medical History:  Diagnosis Date   Allergy    SEASONAL   Arthritis    shoulder fracture , ankle    Blood transfusion without reported diagnosis    with pregnancy    Cataract    BEGINING STAGES    Fibrocystic breast    GERD (gastroesophageal reflux disease)    diet related    Hyperlipidemia    hx of and on red yeast rice    Hypertension    Neuromuscular disorder (HCC)    FROM CHOLESTEROL MEDICINE   Osteopenia    Peripheral vascular disease (HCC)    PONV (postoperative nausea and vomiting)     Past Surgical History:  Procedure Laterality Date   ABDOMINAL HYSTERECTOMY     ANKLE SURGERY Left    BLADDER BIOPSY     BREAST EXCISIONAL BIOPSY Left 1987   benign   BREAST SURGERY     COLONOSCOPY     ENDOVENOUS ABLATION SAPHENOUS VEIN W/ LASER Right 07/01/2017   endovenous laser ablation R SSV by Lynwood Collum MD   HAND SURGERY Left    multiple times   ORIF SHOULDER FRACTURE     no surgery   TONSILLECTOMY     VEIN SURGERY      No current facility-administered medications for this encounter.   Current Outpatient Medications  Medication Sig Dispense Refill Last Dose/Taking   aspirin  81 MG tablet Take 81 mg by mouth daily.   Taking   Cholecalciferol (VITAMIN D) 2000 units CAPS Take 2,000 Units by mouth daily.   Taking   hydrochlorothiazide  (HYDRODIURIL ) 50 MG tablet Take 25-50 mg by mouth daily. May take an additional 25mg s for shortness of breath/ fluid   Taking   Multiple Vitamin (MULTIVITAMIN WITH MINERALS) TABS tablet Take 1 tablet by mouth daily.  Taking   potassium chloride  (K-DUR) 10 MEQ tablet Take 10 mEq by mouth daily.   Taking   Red Yeast Rice 600 MG TABS Take 1,200 mg by mouth daily.   Taking   Allergies  Allergen Reactions   Cholesterol     Muscle pains   Hydrocodone Hives    Unsure if true allergy as developed rash after a surgery years ago and had been given hydrocodone    Lipitor  [Atorvastatin Calcium ]    Mobic [Meloxicam]     Ankle swelling    Morphine And Codeine  Nausea And Vomiting   Nickel    Sulfa Antibiotics Hives   Codeine  Rash   Latex Rash   Naproxen Hives    Able to take Ibuprofen  w/o reaction   Penicillins Rash and Other (See Comments)     Social History   Tobacco Use   Smoking status: Former    Types: Cigarettes    Passive exposure: Never   Smokeless tobacco: Never   Tobacco comments:    40 yrs ago  Substance Use Topics   Alcohol use: No    Family History  Problem Relation Age of Onset   Diabetes Mother    Esophageal cancer Father    Breast cancer Sister 80   Breast cancer Sister    Lung cancer Sister    Stroke Sister    Pancreatic cancer Sister    Colon cancer Brother        Dx'd late 71's, early 68's   Prostate cancer Brother    Colon polyps Neg Hx    Crohn's disease Neg Hx    Rectal cancer Neg Hx    Stomach cancer Neg Hx    Ulcerative colitis Neg Hx      Review of Systems  Constitutional:  Negative for chills and fever.  Respiratory:  Negative for cough and shortness of breath.   Cardiovascular:  Negative for chest pain.  Gastrointestinal:  Negative for nausea and vomiting.  Musculoskeletal:  Positive for arthralgias.     Objective:  Physical Exam Well nourished and well developed. General: Alert and oriented x3, cooperative and pleasant, no acute distress.  Musculoskeletal: Right knee exam: No palpable effusion, warmth erythema Valgus right knee with slight flexion contracture Passively correctable valgus with pain Tenderness laterally and anteriorly Normal ipsilateral right hip exam without groin pain or referred pain No lower extremity edema, erythema or calf tenderness  Left knee exam: No palpable effusion A more neutral alignment left lower extremity Full knee extension and flexion to 120 degrees Mild tenderness medially more so than laterally on the left knee    Vital signs in last 24 hours:    Labs:   Estimated body mass index is 34.86 kg/m as calculated from the following:   Height as of 09/26/23: 5' 6 (1.676 m).   Weight as of 09/26/23: 98 kg.   Imaging Review Plain radiographs demonstrate severe degenerative joint disease of the right knee(s). The overall  alignment isneutral. The bone quality appears to be adequate for age and reported activity level.      Assessment/Plan:  End stage arthritis, right knee   The patient history, physical examination, clinical judgment of the provider and imaging studies are consistent with end stage degenerative joint disease of the right knee(s) and total knee arthroplasty is deemed medically necessary. The treatment options including medical management, injection therapy arthroscopy and arthroplasty were discussed at length. The risks and benefits of total knee arthroplasty were presented and reviewed. The  risks due to aseptic loosening, infection, stiffness, patella tracking problems, thromboembolic complications and other imponderables were discussed. The patient acknowledged the explanation, agreed to proceed with the plan and consent was signed. Patient is being admitted for inpatient treatment for surgery, pain control, PT, OT, prophylactic antibiotics, VTE prophylaxis, progressive ambulation and ADL's and discharge planning. The patient is planning to be discharged home.     Patient's anticipated LOS is less than 2 midnights, meeting these requirements: - Younger than 17 - Lives within 1 hour of care - Has a competent adult at home to recover with post-op recover - NO history of  - Chronic pain requiring opiods  - Diabetes  - Coronary Artery Disease  - Heart failure  - Heart attack  - Stroke  - DVT/VTE  - Cardiac arrhythmia  - Respiratory Failure/COPD  - Renal failure  - Anemia  - Advanced Liver disease  Rosina Calin, PA-C Orthopedic Surgery [facility] [place] [phone]

## 2023-10-08 NOTE — Anesthesia Preprocedure Evaluation (Addendum)
 Anesthesia Evaluation  Patient identified by MRN, date of birth, ID band Patient awake    Reviewed: Allergy & Precautions, H&P , NPO status , Patient's Chart, lab work & pertinent test results  History of Anesthesia Complications (+) PONV and history of anesthetic complications  Airway Mallampati: II  TM Distance: >3 FB Neck ROM: Full    Dental no notable dental hx. (+) Dental Advisory Given, Poor Dentition, Chipped, Missing   Pulmonary neg pulmonary ROS, former smoker   Pulmonary exam normal breath sounds clear to auscultation       Cardiovascular hypertension, Pt. on medications + Peripheral Vascular Disease  negative cardio ROS Normal cardiovascular exam Rhythm:Regular Rate:Normal     Neuro/Psych  Neuromuscular disease negative neurological ROS  negative psych ROS   GI/Hepatic negative GI ROS, Neg liver ROS,GERD  Medicated,,  Endo/Other  negative endocrine ROS    Renal/GU negative Renal ROS  negative genitourinary   Musculoskeletal negative musculoskeletal ROS (+) Arthritis ,    Abdominal   Peds negative pediatric ROS (+)  Hematology negative hematology ROS (+)   Anesthesia Other Findings   Reproductive/Obstetrics negative OB ROS                              Anesthesia Physical Anesthesia Plan  ASA: 3  Anesthesia Plan: Spinal, MAC and Regional   Post-op Pain Management: Minimal or no pain anticipated   Induction: Intravenous  PONV Risk Score and Plan: 3 and Propofol  infusion, Ondansetron  and Dexamethasone   Airway Management Planned: Simple Face Mask and Natural Airway  Additional Equipment: None  Intra-op Plan:   Post-operative Plan:   Informed Consent:   Plan Discussed with: Anesthesiologist and CRNA  Anesthesia Plan Comments: (  )         Anesthesia Quick Evaluation

## 2023-10-08 NOTE — Interval H&P Note (Signed)
 History and Physical Interval Note:  10/08/2023 1:11 PM  Marie [name]  has presented today for surgery, with the diagnosis of Right knee osteoarthritis.  The various methods of treatment have been discussed with the patient and family. After consideration of risks, benefits and other options for treatment, the patient has consented to  Procedure(s): ARTHROPLASTY, KNEE, TOTAL (Right) as a surgical intervention.  The patient's history has been reviewed, patient examined, no change in status, stable for surgery.  I have reviewed the patient's chart and labs.  Questions were answered to the patient's satisfaction.     [name]

## 2023-10-08 NOTE — Transfer of Care (Signed)
 Immediate Anesthesia Transfer of Care Note  Patient: Marie [name]  Procedure(s) Performed: ARTHROPLASTY, KNEE, TOTAL (Right: Knee)  Patient Location: PACU  Anesthesia Type:MAC combined with regional for post-op pain  Level of Consciousness: awake, alert , and oriented  Airway & Oxygen Therapy: Patient Spontanous Breathing  Post-op Assessment: Report given to RN and Post -op Vital signs reviewed and stable  Post vital signs: Reviewed and stable  Last Vitals:  Vitals Value Taken Time  BP 117/66 10/08/23 15:58  Temp    Pulse 85 10/08/23 15:59  Resp 19 10/08/23 15:59  SpO2 94 % 10/08/23 15:59  Vitals shown include unfiled device data.  Last Pain:  Vitals:   10/08/23 1226  TempSrc:   PainSc: 5       Patients Stated Pain Goal: 5 (10/08/23 1226)  Complications: No notable events documented.

## 2023-10-08 NOTE — Anesthesia Procedure Notes (Signed)
 Anesthesia Regional Block: Adductor canal block   Pre-Anesthetic Checklist: , timeout performed,  Correct Patient, Correct Site, Correct Laterality,  Correct Procedure, Correct Position, site marked,  Risks and benefits discussed,  Surgical consent,  Pre-op evaluation,  At surgeon's request and post-op pain management  Laterality: Right  Prep: chloraprep       Needles:  Injection technique: Single-shot  Needle Type: Echogenic Stimulator Needle     Needle Length: 5cm  Needle Gauge: 22     Additional Needles:   Procedures:, nerve stimulator,,, ultrasound used (permanent image in chart),,    Narrative:  Start time: 10/08/2023 1:40 PM End time: 10/08/2023 1:45 PM Injection made incrementally with aspirations every 5 mL.  Performed by: Personally  Anesthesiologist: Mallory Manus, MD  Additional Notes: Functioning IV was confirmed and monitors were applied.  A 50mm 22ga Arrow echogenic stimulator needle was used. Sterile prep and drape,hand hygiene and sterile gloves were used. Ultrasound guidance: relevant anatomy identified, needle position confirmed, local anesthetic spread visualized around nerve(s)., vascular puncture avoided.  Image printed for medical record. Negative aspiration and negative test dose prior to incremental administration of local anesthetic. The patient tolerated the procedure well.

## 2023-10-08 NOTE — Anesthesia Procedure Notes (Signed)
 Spinal  Patient location during procedure: OR Start time: 10/08/2023 2:17 PM End time: 10/08/2023 2:22 PM Reason for block: surgical anesthesia Staffing Performed: anesthesiologist  Anesthesiologist: Darlyn Rush, MD Performed by: Darlyn Rush, MD Authorized by: Darlyn Rush, MD   Preanesthetic Checklist Completed: patient identified, IV checked, site marked, risks and benefits discussed, surgical consent, monitors and equipment checked, pre-op evaluation and timeout performed Spinal Block Patient position: sitting Prep: DuraPrep and site prepped and draped Patient monitoring: heart rate, continuous pulse ox and blood pressure Location: L4-5 Injection technique: single-shot Needle Needle type: Spinocan  Needle gauge: 25 G Needle length: 9 cm Additional Notes Expiration date of kit checked and confirmed. Patient tolerated procedure well, without complications.

## 2023-10-08 NOTE — Op Note (Signed)
 NAME:  Marie [name]                      MEDICAL RECORD NO.:  [id number]                             FACILITY:  [facility]      PHYSICIAN:  Donnice BIRCH. Ernie, M.D.  DATE OF BIRTH:  August 13, 1945      DATE OF PROCEDURE:  10/08/2023                                     OPERATIVE REPORT         PREOPERATIVE DIAGNOSIS:  Right knee osteoarthritis.      POSTOPERATIVE DIAGNOSIS:  Right knee osteoarthritis.      FINDINGS:  The patient was noted to have complete loss of cartilage and   bone-on-bone arthritis with associated osteophytes in the lateral and patellofemoral compartments of   the knee with a significant synovitis and associated effusion.  The patient had failed months of conservative treatment including medications, injection therapy, activity modification.     PROCEDURE:  Right total knee replacement.      COMPONENTS USED:  Zimmer/[employer] Persona knee system with size right 5 N nitrided femur, size right D tivanium baseplaste, size 11 mm CR MC insert and a 35 patella button     SURGEON:  Donnice BIRCH. Ernie, M.D.      ASSISTANT:  Rosina Calin, PA-C.      ANESTHESIA:  Regional and Spinal.      SPECIMENS:  None.      COMPLICATION:  None.      DRAINS:  None.  EBL: <200 cc      TOURNIQUET TIME:  29 min at 225 mmHg     The patient was stable to the recovery room.      INDICATION FOR PROCEDURE:  Marie [name] is a 78 y.o. female patient of   mine.  The patient had been seen, evaluated, and treated for months conservatively in the   office with medication, activity modification, and injections.  The patient had   radiographic changes of bone-on-bone arthritis with endplate sclerosis and osteophytes noted.  Based on the radiographic changes and failed conservative measures, the patient   decided to proceed with definitive treatment, total knee replacement.  Risks of infection, DVT, component failure, need for revision surgery, neurovascular injury were reviewed in the office setting.   The postop course was reviewed stressing the efforts to maximize post-operative satisfaction and function.  Consent was obtained for benefit of pain   relief.      PROCEDURE IN DETAIL:  The patient was brought to the operative theater.   Once adequate anesthesia, preoperative antibiotics, 2 gm of Ancef ,1 gm of Tranexamic Acid , and 10 mg of Decadron  administered, the patient was positioned supine with a right thigh tourniquet placed.  The  right lower extremity was prepped and draped in sterile fashion.  A time-   out was performed identifying the patient, planned procedure, and the appropriate extremity.      The right lower extremity was placed in the [facility] leg holder.  The leg was   exsanguinated, tourniquet elevated to 225 mmHg.  A midline incision was   made followed by median parapatellar arthrotomy.  Following initial   exposure, attention was first directed to  the patella.  Precut   measurement was noted to be 22 mm.  I resected down to 13 mm and used a   35 anatomic patellar button to restore patellar height as well as cover the cut surface.      The lug holes were drilled and a metal shim was placed to protect the   patella from retractors and saw blade during the procedure.      At this point, attention was now directed to the femur.  The femoral   canal was opened with a drill, irrigated to try to prevent fat emboli.  An   intramedullary rod was passed at 3 degrees valgus, 9 mm of bone was   resected off the distal femur.  Following this resection, the tibia was   subluxated anteriorly.  Using the extramedullary guide, 2 mm of bone was resected off   the proximal lateral tibia.  We confirmed the gap would be   stable medially and laterally with a size 5 spacer block as well as confirmed that the tibial cut was perpendicular in the coronal plane, checking with an alignment rod.      Once this was done, I sized the femur to be a size 5 in the anterior-   posterior dimension.   The rotation was set based off the posterior condyles.  The 4 in 1 cutting block was impacted into place.  The   anterior, posterior, and  chamfer cuts were made without difficulty nor   notching making certain that I was along the anterior cortex to help   with flexion gap stability.      The trial femoral component was placed at which point I determined that I would use the narrow size 5 femoral component.     At this point, the tibia was sized to be a size D.  The size D tray was   then pinned in position through the medial third of the tubercle,   drilled, and keel punched.  Trial reduction was now carried with a 5 femur,  D tibia, a size 11 mm CR MC insert, and the 35 anatomic patella botton.  The knee was brought to full extension with good flexion stability with the patella   tracking through the trochlea without application of pressure.  Given   all these findings the trial components removed.  Final components were   opened and cement was mixed.  The knee was irrigated with normal saline solution and pulse lavage.  The synovial lining was   then injected with 30 cc of 0.25% Marcaine  with epinephrine , 1 cc of Toradol  and 30 cc of NS for a total of 61 cc.     Final implants were then cemented onto cleaned and dried cut surfaces of bone with the knee brought to extension with a size 11 mm CR MC trial insert.      Once the cement had fully cured, excess cement was removed   throughout the knee.  I confirmed that I was satisfied with the range of   motion and stability, and the final size 11 mm CR MC AOX insert was chosen.  It was   placed into the knee.      The tourniquet had been let down at 29 minutes.  No significant   hemostasis was required.  The extensor mechanism was then reapproximated using #1 Vicryl and #1 Stratafix sutures with the knee   in flexion.  The   remaining wound was closed  with 2-0 Vicryl and running 4-0 Monocryl.   The knee was cleaned, dried, dressed sterilely  using Dermabond and   Aquacel dressing.  The patient was then   brought to recovery room in stable condition, tolerating the procedure   well.   Please note that Physician Assistant, Rosina Calin, PA-C was present for the entirety of the case, and was utilized for pre-operative positioning, peri-operative retractor management, general facilitation of the procedure and for primary wound closure at the end of the case.              Donnice CORDOBA Ernie, M.D.    10/08/2023 1:11 PM

## 2023-10-08 NOTE — Care Plan (Signed)
 Ortho Bundle Case Management Note  Patient Details  Name: Marie [name] MRN: [id number] Date of Birth: 06/05/1945  R TKA on 10/08/23.  DCP: Home with various friends and family  DME: No needs. Has RW and cane. PT: EO                   DME Arranged:  N/A DME Agency:  NA  HH Arranged:    HH Agency:     Additional Comments: Please contact me with any questions of if this plan should need to change.  Lyle Pepper, [place] [facility] [phone]   10/08/2023, 12:57 PM

## 2023-10-08 NOTE — Discharge Instructions (Addendum)
 Dr. Donnice Car Total [facility] Specialist [facility] [place] [address] [place], [place] [address] [phone]  Pain Regimen Instructions:  - Take tylenol  1000 mg every 6-8 hours around the clock  - Take the muscle relaxant around the clock \ - Take ibuprofen  as directed - Then take 5 mg (1 tablet) of oxycodone  every 4 hours as needed for severe pain  - Ice and elevate your leg as often as you can  Do not take over the counter pain medication until instructed by us .  If this is not controlling your pain, or you need refills - call our office at [phone] or send a message via the [employer] portal.   Take the stool softeners provided until you are having regular bowel movements, and then you may discontinue.     INSTRUCTIONS AFTER JOINT REPLACEMENT   Remove items at home which could result in a fall. This includes throw rugs or furniture in walking pathways ICE to the affected joint every three hours while awake for 30 minutes at a time, for at least the first 3-5 days, and then as needed for pain and swelling.  Continue to use ice for pain and swelling. You may notice swelling that will progress down to the foot and ankle.  This is normal after surgery.  Elevate your leg when you are not up walking on it.   Continue to use the breathing machine you got in the hospital (incentive spirometer) which will help keep your temperature down.  It is common for your temperature to cycle up and down following surgery, especially at night when you are not up moving around and exerting yourself.  The breathing machine keeps your lungs expanded and your temperature down.   DIET:  As you were doing prior to hospitalization, we recommend a well-balanced diet.  DRESSING / WOUND CARE / SHOWERING  Keep the surgical dressing until follow up.  The dressing is water  proof, so you can shower without any extra covering.  IF THE DRESSING FALLS OFF or the wound gets wet inside,  change the dressing with sterile gauze.  Please use good hand washing techniques before changing the dressing.  Do not use any lotions or creams on the incision until instructed by your surgeon.    ACTIVITY  Increase activity slowly as tolerated, but follow the weight bearing instructions below.   No driving for 6 weeks or until further direction given by your physician.  You cannot drive while taking narcotics.  No lifting or carrying greater than 10 lbs. until further directed by your surgeon. Avoid periods of inactivity such as sitting longer than an hour when not asleep. This helps prevent blood clots.  You may return to work once you are authorized by your doctor.     WEIGHT BEARING   Weight bearing as tolerated with assist device (walker, cane, etc) as directed, use it as long as suggested by your surgeon or therapist, typically at least 4-6 weeks.   EXERCISES  Results after joint replacement surgery are often greatly improved when you follow the exercise, range of motion and muscle strengthening exercises prescribed by your doctor. Safety measures are also important to protect the joint from further injury. Any time any of these exercises cause you to have increased pain or swelling, decrease what you are doing until you are comfortable again and then slowly increase them. If you have problems or questions, call your caregiver or physical therapist for advice.   Rehabilitation is  important following a joint replacement. After just a few days of immobilization, the muscles of the leg can become weakened and shrink (atrophy).  These exercises are designed to build up the tone and strength of the thigh and leg muscles and to improve motion. Often times heat used for twenty to thirty minutes before working out will loosen up your tissues and help with improving the range of motion but do not use heat for the first two weeks following surgery (sometimes heat can increase post-operative  swelling).   These exercises can be done on a training (exercise) mat, on the floor, on a table or on a bed. Use whatever works the best and is most comfortable for you.    Use music or television while you are exercising so that the exercises are a pleasant break in your day. This will make your life better with the exercises acting as a break in your routine that you can look forward to.   Perform all exercises about fifteen times, three times per day or as directed.  You should exercise both the operative leg and the other leg as well.  Exercises include:   Quad Sets - Tighten up the muscle on the front of the thigh (Quad) and hold for 5-10 seconds.   Straight Leg Raises - With your knee straight (if you were given a brace, keep it on), lift the leg to 60 degrees, hold for 3 seconds, and slowly lower the leg.  Perform this exercise against resistance later as your leg gets stronger.  Leg Slides: Lying on your back, slowly slide your foot toward your buttocks, bending your knee up off the floor (only go as far as is comfortable). Then slowly slide your foot back down until your leg is flat on the floor again.  Angel Wings: Lying on your back spread your legs to the side as far apart as you can without causing discomfort.  Hamstring Strength:  Lying on your back, push your heel against the floor with your leg straight by tightening up the muscles of your buttocks.  Repeat, but this time bend your knee to a comfortable angle, and push your heel against the floor.  You may put a pillow under the heel to make it more comfortable if necessary.   A rehabilitation program following joint replacement surgery can speed recovery and prevent re-injury in the future due to weakened muscles. Contact your doctor or a physical therapist for more information on knee rehabilitation.    CONSTIPATION  Constipation is defined medically as fewer than three stools per week and severe constipation as less than one stool  per week.  Even if you have a regular bowel pattern at home, your normal regimen is likely to be disrupted due to multiple reasons following surgery.  Combination of anesthesia, postoperative narcotics, change in appetite and fluid intake all can affect your bowels.   YOU MUST use at least one of the following options; they are listed in order of increasing strength to get the job done.  They are all available over the counter, and you may need to use some, POSSIBLY even all of these options:    Drink plenty of fluids (prune juice may be helpful) and high fiber foods Colace 100 mg by mouth twice a day  Senokot for constipation as directed and as needed Dulcolax (bisacodyl ), take with full glass of water   Miralax  (polyethylene glycol) once or twice a day as needed.  If you have tried all  these things and are unable to have a bowel movement in the first 3-4 days after surgery call either your surgeon or your primary doctor.    If you experience loose stools or diarrhea, hold the medications until you stool forms back up.  If your symptoms do not get better within 1 week or if they get worse, check with your doctor.  If you experience the worst abdominal pain ever or develop nausea or vomiting, please contact the office immediately for further recommendations for treatment.   ITCHING:  If you experience itching with your medications, try taking only a single pain pill, or even half a pain pill at a time.  You can also use Benadryl  over the counter for itching or also to help with sleep.   TED HOSE STOCKINGS:  Use stockings on both legs until for at least 2 weeks or as directed by physician office. They may be removed at night for sleeping.  MEDICATIONS:  See your medication summary on the "After Visit Summary" that nursing will review with you.  You may have some home medications which will be placed on hold until you complete the course of blood thinner medication.  It is important for you to  complete the blood thinner medication as prescribed.  PRECAUTIONS:  If you experience chest pain or shortness of breath - call 911 immediately for transfer to the hospital emergency department.   If you develop a fever greater that 101 F, purulent drainage from wound, increased redness or drainage from wound, foul odor from the wound/dressing, or calf pain - CONTACT YOUR SURGEON.                                                   FOLLOW-UP APPOINTMENTS:  If you do not already have a post-op appointment, please call the office for an appointment to be seen by your surgeon.  Guidelines for how soon to be seen are listed in your "After Visit Summary", but are typically between 1-4 weeks after surgery.  OTHER INSTRUCTIONS:   Knee Replacement:  Do not place pillow under knee, focus on keeping the knee straight while resting. CPM instructions: 0-90 degrees, 2 hours in the morning, 2 hours in the afternoon, and 2 hours in the evening. Place foam block, curve side up under heel at all times except when in CPM or when walking.  DO NOT modify, tear, cut, or change the foam block in any way.  POST-OPERATIVE OPIOID TAPER INSTRUCTIONS: It is important to wean off of your opioid medication as soon as possible. If you do not need pain medication after your surgery it is ok to stop day one. Opioids include: Codeine , Hydrocodone(Norco, Vicodin), Oxycodone (Percocet, oxycontin ) and hydromorphone  amongst others.  Long term and even short term use of opiods can cause: Increased pain response Dependence Constipation Depression Respiratory depression And more.  Withdrawal symptoms can include Flu like symptoms Nausea, vomiting And more Techniques to manage these symptoms Hydrate well Eat regular healthy meals Stay active Use relaxation techniques(deep breathing, meditating, yoga) Do Not substitute Alcohol to help with tapering If you have been on opioids for less than two weeks and do not have pain than it  is ok to stop all together.  Plan to wean off of opioids This plan should start within one week post op of your joint replacement. Maintain  the same interval or time between taking each dose and first decrease the dose.  Cut the total daily intake of opioids by one tablet each day Next start to increase the time between doses. The last dose that should be eliminated is the evening dose.   MAKE SURE YOU:  Understand these instructions.  Get help right away if you are not doing well or get worse.    Thank you for letting us  be a part of your medical care team.  It is a privilege we respect greatly.  We hope these instructions will help you stay on track for a fast and full recovery!

## 2023-10-09 ENCOUNTER — Other Ambulatory Visit (HOSPITAL_COMMUNITY): Payer: Self-pay

## 2023-10-09 ENCOUNTER — Other Ambulatory Visit (HOSPITAL_BASED_OUTPATIENT_CLINIC_OR_DEPARTMENT_OTHER): Payer: Self-pay

## 2023-10-09 DIAGNOSIS — M1711 Unilateral primary osteoarthritis, right knee: Secondary | ICD-10-CM | POA: Diagnosis not present

## 2023-10-09 LAB — BASIC METABOLIC PANEL WITH GFR
Anion gap: 12 (ref 5–15)
BUN: 12 mg/dL (ref 8–23)
CO2: 24 mmol/L (ref 22–32)
Calcium: 8.9 mg/dL (ref 8.9–10.3)
Chloride: 100 mmol/L (ref 98–111)
Creatinine, Ser: 0.53 mg/dL (ref 0.44–1.00)
GFR, Estimated: >60 mL/min (ref >60)
Glucose, Bld: 143 mg/dL — ABNORMAL HIGH (ref 70–99)
Potassium: 3.5 mmol/L (ref 3.5–5.1)
Sodium: 136 mmol/L (ref 135–145)

## 2023-10-09 LAB — CBC
HCT: 41.9 % (ref 36.0–46.0)
Hemoglobin: 13.3 g/dL (ref 12.0–15.0)
MCH: 28.9 pg (ref 26.0–34.0)
MCHC: 31.7 g/dL (ref 30.0–36.0)
MCV: 91.1 fL (ref 80.0–100.0)
Platelets: 261 K/uL (ref 150–400)
RBC: 4.6 MIL/uL (ref 3.87–5.11)
RDW: 12.7 % (ref 11.5–15.5)
WBC: 15.7 K/uL — ABNORMAL HIGH (ref 4.0–10.5)
nRBC: 0 % (ref 0.0–0.2)

## 2023-10-09 MED ORDER — POLYETHYLENE GLYCOL 3350 17 GM/SCOOP PO POWD
17.0000 g | Freq: Two times a day (BID) | ORAL | 0 refills | Status: AC
  Filled 2023-10-09: qty 476, 14d supply, fill #0

## 2023-10-09 MED ORDER — KETOROLAC TROMETHAMINE 15 MG/ML IJ SOLN
7.5000 mg | Freq: Once | INTRAMUSCULAR | Status: AC
  Administered 2023-10-09: 7.5 mg via INTRAVENOUS
  Filled 2023-10-09: qty 1

## 2023-10-09 MED ORDER — IBUPROFEN 800 MG PO TABS
800.0000 mg | ORAL_TABLET | Freq: Three times a day (TID) | ORAL | 0 refills | Status: AC
  Filled 2023-10-09: qty 90, 30d supply, fill #0

## 2023-10-09 MED ORDER — SENNA 8.6 MG PO TABS
2.0000 | ORAL_TABLET | Freq: Every day | ORAL | 0 refills | Status: AC
  Filled 2023-10-09: qty 28, 14d supply, fill #0

## 2023-10-09 MED ORDER — ACETAMINOPHEN 500 MG PO TABS: 1000.0000 mg | ORAL_TABLET | Freq: Four times a day (QID) | ORAL | Status: AC

## 2023-10-09 MED ORDER — ASPIRIN 81 MG PO CHEW
81.0000 mg | CHEWABLE_TABLET | Freq: Two times a day (BID) | ORAL | 0 refills | Status: AC
  Filled 2023-10-09: qty 56, 28d supply, fill #0

## 2023-10-09 MED ORDER — METHOCARBAMOL 500 MG PO TABS
500.0000 mg | ORAL_TABLET | Freq: Four times a day (QID) | ORAL | 2 refills | Status: AC | PRN
Start: 2023-10-09 — End: ?
  Filled 2023-10-09: qty 40, 10d supply, fill #0

## 2023-10-09 MED ORDER — OXYCODONE HCL 5 MG PO TABS
5.0000 mg | ORAL_TABLET | ORAL | 0 refills | Status: AC | PRN
  Filled 2023-10-09: qty 42, 7d supply, fill #0

## 2023-10-09 NOTE — Evaluation (Signed)
 Physical Therapy Evaluation Marie [name] Details Name: Marie Marie [name] MRN: [id number] DOB: Aug 12, 1945 Today's Date: 10/09/2023  History of Present Illness  78 yo female s/p R TKA on 10/09/23. PMH: PVD, ankle surgery,  shoulder fx, hand surgery  Clinical Impression  Pt is s/p TKA resulting in the deficits listed below (see PT Problem List).  Pt motivated to work with PT, however pt expresses concern regarding d/c home today. Will see how pt progresses this pm however pt caregiver cannot provide any physical assist, pt will need to progress to supervision to mod I level for safe d/c home.  Pt will benefit from acute skilled PT to increase their independence and safety with mobility to allow discharge.          If plan is discharge home, recommend the following: A little help with walking and/or transfers;A little help with bathing/dressing/bathroom;Assist for transportation;Assistance with cooking/housework;Help with stairs or ramp for entrance   Can travel by private vehicle        Equipment Recommendations None recommended by PT  Recommendations for Other Services       Functional Status Assessment Marie [name] has had a recent decline in their functional status and demonstrates the ability to make significant improvements in function in a reasonable and predictable amount of time.     Precautions / Restrictions Precautions Precautions: Fall;Knee Restrictions Weight Bearing Restrictions Per Provider Order: No Other Position/Activity Restrictions: WBAT      Mobility  Bed Mobility Overal bed mobility: Needs Assistance Bed Mobility: Supine to Sit     Supine to sit: Contact guard, Min assist     General bed mobility comments: for safety, assist to progress RLE off bed    Transfers Overall transfer level: Needs assistance Equipment used: Rolling walker (2 wheels) Transfers: Sit to/from Stand Sit to Stand: Min assist           General transfer comment: light assist to  rise,cues for hand placement and RLE position    Ambulation/Gait Ambulation/Gait assistance: Min assist Gait Distance (Feet): 60 Feet (+10' more) Assistive device: Rolling walker (2 wheels) Gait Pattern/deviations: Step-to pattern, Decreased stance time - right Gait velocity: decr     General Gait Details: verbal and visual cues for sequence, RW position, knee flexion during swing as well as heel strike RLE with initial contact  Stairs            Wheelchair Mobility     Tilt Bed    Modified Rankin (Stroke Patients Only)       Balance Overall balance assessment: Needs assistance Sitting-balance support: Feet supported, No upper extremity supported Sitting balance-Leahy Scale: Good     Standing balance support: During functional activity, Reliant on assistive device for balance Standing balance-Leahy Scale: Poor                               Pertinent Vitals/Pain Pain Assessment Pain Assessment: 0-10 Pain Score: 5  Pain Location: right knee Pain Descriptors / Indicators: Aching, Sore Pain Intervention(s): Limited activity within Marie [name]'s tolerance, Monitored during session, Premedicated before session, Repositioned    Home Living Family/Marie [name] expects to be discharged to:: Private residence Living Arrangements: Alone Available Help at Discharge: Family Type of Home: House Home Access: Stairs to enter Entrance Stairs-Rails: None Entrance Stairs-Number of Steps: 2   Home Layout: One level Home Equipment: Agricultural consultant (2 wheels);Cane - single point Additional Comments: has assist for    Prior Function Prior  Level of Function : Independent/Modified Independent             Mobility Comments: amb with cane,  I was walking with a peg leg ADLs Comments: ind     Extremity/Trunk Assessment   Upper Extremity Assessment Upper Extremity Assessment: Overall WFL for tasks assessed    Lower Extremity Assessment Lower Extremity  Assessment: RLE deficits/detail RLE Deficits / Details: ankle WFL, knee extension and hip flexion grossly 3/5, limited by post op deficits as anticipated       Communication   Communication Communication: No apparent difficulties    Cognition Arousal: Alert Behavior During Therapy: WFL for tasks assessed/performed   PT - Cognitive impairments: No apparent impairments                         Following commands: Intact       Cueing Cueing Techniques: Verbal cues     General Comments      Exercises Total Joint Exercises Ankle Circles/Pumps: AROM, Both, 5 reps   Assessment/Plan    PT Assessment Marie [name] needs continued PT services  PT Problem List Decreased strength;Decreased activity tolerance;Decreased balance;Pain;Decreased knowledge of use of DME;Decreased mobility;Decreased range of motion       PT Treatment Interventions DME instruction;Therapeutic exercise;Gait training;Stair training;Functional mobility training;Therapeutic activities;Marie [name]/family education    PT Goals (Current goals can be found in the Care Plan section)  Acute Rehab PT Goals Marie [name] Stated Goal: walk normal again PT Goal Formulation: With Marie [name] Time For Goal Achievement: 10/16/23 Potential to Achieve Goals: Good    Frequency 7X/week     Co-evaluation               AM-PAC PT 6 Clicks Mobility  Outcome Measure Help needed turning from your back to your side while in a flat bed without using bedrails?: A Little Help needed moving from lying on your back to sitting on the side of a flat bed without using bedrails?: A Little Help needed moving to and from a bed to a chair (including a wheelchair)?: A Little Help needed standing up from a chair using your arms (e.g., wheelchair or bedside chair)?: A Little Help needed to walk in hospital room?: A Little Help needed climbing 3-5 steps with a railing? : A Little 6 Click Score: 18    End of Session Equipment Utilized  During Treatment: Gait belt Activity Tolerance: Marie [name] tolerated treatment well Marie [name] left: in chair;with call bell/phone within reach;with chair alarm set Nurse Communication: Mobility status PT Visit Diagnosis: Other abnormalities of gait and mobility (R26.89)    Time: 8775-8750 PT Time Calculation (min) (ACUTE ONLY): 25 min   Charges:   PT Evaluation $PT Eval Low Complexity: 1 Low PT Treatments $Gait Training: 8-22 mins PT General Charges $$ ACUTE PT VISIT: 1 Visit         Khole Arterburn, PT  Acute Rehab Dept ([facility]) [phone]  10/09/2023   [facility] 10/09/2023, 1:02 PM

## 2023-10-09 NOTE — Care Management Obs Status (Signed)
 MEDICARE OBSERVATION STATUS NOTIFICATION   Patient Details  Name: Marie [name] MRN: [id number] Date of Birth: 1945-11-23   Medicare Observation Status Notification Given:  Yes    Alfonse JONELLE Rex, RN 10/09/2023, 9:52 AM

## 2023-10-09 NOTE — Progress Notes (Signed)
 Subjective: 1 Day Post-Op Procedure(s) (LRB): ARTHROPLASTY, KNEE, TOTAL (Right) Patient reports pain as moderate  Patient seen in rounds with Dr. Ernie. Patient is well, and has had no acute complaints or problems. No acute events overnight. She is in pain this morning. Foley catheter removed. Patient has not been up with PT yet.  We will start therapy today.   Objective: Vital signs in last 24 hours: Temp:  [97.4 F (36.3 C)-98.4 F (36.9 C)] 97.4 F (36.3 C) (08/06 0608) Pulse Rate:  [66-90] 66 (08/06 0608) Resp:  [10-25] 18 (08/06 0608) BP: (103-186)/(64-86) 155/83 (08/06 0608) SpO2:  [92 %-98 %] 97 % (08/06 0608) Weight:  [98 kg] 98 kg (08/05 1226)  Intake/Output from previous day:  Intake/Output Summary (Last 24 hours) at 10/09/2023 0738 Last data filed at 10/09/2023 0600 Gross per 24 hour  Intake 2745.78 ml  Output 3000 ml  Net -254.22 ml     Intake/Output this shift: No intake/output data recorded.  Labs: Recent Labs    10/09/23 0351  HGB 13.3   Recent Labs    10/09/23 0351  WBC 15.7*  RBC 4.60  HCT 41.9  PLT 261   Recent Labs    10/09/23 0351  NA 136  K 3.5  CL 100  CO2 24  BUN 12  CREATININE 0.53  GLUCOSE 143*  CALCIUM  8.9   No results for input(s): LABPT, INR in the last 72 hours.  Exam: General - Patient is Alert and Oriented Extremity - Neurologically intact Sensation intact distally Intact pulses distally Dorsiflexion/Plantar flexion intact Dressing - dressing C/D/I Motor Function - intact, moving foot and toes well on exam.   Past Medical History:  Diagnosis Date   Allergy    SEASONAL   Arthritis    shoulder fracture , ankle    Blood transfusion without reported diagnosis    with pregnancy    Cataract    BEGINING STAGES   Fibrocystic breast    GERD (gastroesophageal reflux disease)    diet related    Hyperlipidemia    hx of and on red yeast rice    Hypertension    Neuromuscular disorder (HCC)    FROM CHOLESTEROL  MEDICINE   Osteopenia    Peripheral vascular disease (HCC)    PONV (postoperative nausea and vomiting)     Assessment/Plan: 1 Day Post-Op Procedure(s) (LRB): ARTHROPLASTY, KNEE, TOTAL (Right) Principal Problem:   S/P total knee replacement, right Active Problems:   S/P total knee arthroplasty, right  Estimated body mass index is 34.86 kg/m as calculated from the following:   Height as of this encounter: 5' 6 (1.676 m).   Weight as of this encounter: 98 kg. Advance diet Up with therapy D/C IV fluids   Patient's anticipated LOS is less than 2 midnights, meeting these requirements: - Younger than 18 - Lives within 1 hour of care - Has a competent adult at home to recover with post-op recover - NO history of  - Chronic pain requiring opiods  - Diabetes  - Coronary Artery Disease  - Heart failure  - Heart attack  - Stroke  - DVT/VTE  - Cardiac arrhythmia  - Respiratory Failure/COPD  - Renal failure  - Anemia  - Advanced Liver disease     DVT Prophylaxis - Aspirin  Weight bearing as tolerated.  Hgb stable at 13.3 this AM.  Based on her pain this morning, I discussed trying toradol . She does tolerate ibuprofen  at home, which we will do at d/c. She  is agreeable to trying.  Patient inquired about home health, transportation services, and meal services through her insurance. I do not feel HH CNA is needed, but will ask TOC about the other options.   Plan is to go Home after hospital stay. Plan for discharge today following 1-2 sessions of PT as long as they are meeting their goals. Patient is scheduled for OPPT. Follow up in the office in 2 weeks.   Rosina Calin, PA-C Orthopedic Surgery [phone] 10/09/2023, 7:38 AM

## 2023-10-09 NOTE — Plan of Care (Signed)

## 2023-10-09 NOTE — Anesthesia Postprocedure Evaluation (Signed)
 Anesthesia Post Note  Patient: Jessy LITTIE Satterfield  Procedure(s) Performed: ARTHROPLASTY, KNEE, TOTAL (Right: Knee)     Patient location during evaluation: PACU Anesthesia Type: Regional, MAC and Spinal Level of consciousness: oriented and awake and alert Pain management: pain level controlled Vital Signs Assessment: post-procedure vital signs reviewed and stable Respiratory status: spontaneous breathing, respiratory function stable and patient connected to nasal cannula oxygen Cardiovascular status: blood pressure returned to baseline and stable Postop Assessment: no headache, no backache and no apparent nausea or vomiting Anesthetic complications: no   No notable events documented.  Last Vitals:  Vitals:   10/09/23 0149 10/09/23 0608  BP: (!) 146/78 (!) 155/83  Pulse: 72 66  Resp: 18 18  Temp: 36.4 C (!) 36.3 C  SpO2: 96% 97%    Last Pain:  Vitals:   10/09/23 0800  TempSrc:   PainSc: 6                  Yanin Muhlestein

## 2023-10-09 NOTE — TOC Transition Note (Signed)
 Transition of Care [facility]) - Discharge Note   Patient Details  Name: Marie [name] MRN: [id number] Date of Birth: February 22, 1946  [facility] CM/SW Contact:  Alfonse JONELLE Rex, RN Phone Number: 10/09/2023, 10:51 AM   Clinical Narrative:  Met with patient at bedside to review dc therapy and home equipment needs, pt confirmed OPPT- EO, has RW. Assisted patient in calling her medical insurance member services line to verify benefit for in home meals, transportation and custodial care post discharge. MOON completed. No TOC needs.       Final next level of care: OP Rehab Barriers to Discharge: No Barriers Identified   Patient Goals and CMS Choice Patient states their goals for this hospitalization and ongoing recovery are:: return home          Discharge Placement                       Discharge Plan and Services Additional resources added to the After Visit Summary for                  DME Arranged: N/A DME Agency: NA                  Social Drivers of Health (SDOH) Interventions SDOH Screenings   Food Insecurity: No Food Insecurity (10/08/2023)  Housing: Low Risk  (10/08/2023)  Transportation Needs: No Transportation Needs (10/08/2023)  Utilities: Not At Risk (10/08/2023)  Social Connections: Moderately Integrated (10/08/2023)  Tobacco Use: Medium Risk (10/08/2023)     Readmission Risk Interventions     No data to display

## 2023-10-09 NOTE — Progress Notes (Signed)
 Physical Therapy Treatment Patient Details Name: Marie [name] MRN: [id number] DOB: 05/20/1945 Today's Date: 10/09/2023   History of Present Illness 78 yo female s/p R TKA on 10/09/23. PMH: PVD, ankle surgery,  shoulder fx, hand surgery    PT Comments  Pt continues to make steady progress with knee ROM, and decreasing assist needed with functional activities. Will continue to work towards achieving supervision level with mobility to tasks to allow safe transition to home tomorrow. (Caregiver is only able to provide supervision, pt dtr is on pre-planned trip out of country)    If plan is discharge home, recommend the following: A little help with walking and/or transfers;A little help with bathing/dressing/bathroom;Assist for transportation;Assistance with cooking/housework;Help with stairs or ramp for entrance   Can travel by private vehicle        Equipment Recommendations  None recommended by PT    Recommendations for Other Services       Precautions / Restrictions Precautions Precautions: Fall;Knee Recall of Precautions/Restrictions: Intact Restrictions Weight Bearing Restrictions Per Provider Order: No Other Position/Activity Restrictions: WBAT     Mobility  Bed Mobility Overal bed mobility: Needs Assistance Bed Mobility: Sit to Supine     Supine to sit: Contact guard, Min assist Sit to supine: Supervision   General bed mobility comments: for safety, able to use gait belt as leg lifter    Transfers Overall transfer level: Needs assistance Equipment used: Rolling walker (2 wheels) Transfers: Sit to/from Stand Sit to Stand: Contact guard assist           General transfer comment: CGA for safety from recliner, BSC. cues for hand placement and RLE position    Ambulation/Gait Ambulation/Gait assistance: Contact guard assist, Min assist Gait Distance (Feet): 65 Feet (10') Assistive device: Rolling walker (2 wheels) Gait Pattern/deviations: Step-to pattern,  Decreased stance time - right Gait velocity: decr     General Gait Details: verbal cues for  RW position, knee flexion during swing as well as heel strike RLE on initial contact   Stairs             Wheelchair Mobility     Tilt Bed    Modified Rankin (Stroke Patients Only)       Balance Overall balance assessment: Needs assistance Sitting-balance support: Feet supported, No upper extremity supported Sitting balance-Leahy Scale: Good     Standing balance support: During functional activity, Reliant on assistive device for balance Standing balance-Leahy Scale: Poor                              Communication Communication Communication: No apparent difficulties  Cognition Arousal: Alert Behavior During Therapy: WFL for tasks assessed/performed   PT - Cognitive impairments: No apparent impairments                         Following commands: Intact      Cueing Cueing Techniques: Verbal cues  Exercises Total Joint Exercises Ankle Circles/Pumps: AROM, Both, 10 reps Quad Sets: AROM, Strengthening, Both, 10 reps Heel Slides: AAROM, Right, 10 reps Straight Leg Raises: AROM, AAROM, Strengthening, Right, 10 reps    General Comments        Pertinent Vitals/Pain Pain Assessment Pain Assessment: 0-10 Pain Score: 4  Pain Location: right knee Pain Descriptors / Indicators: Aching, Sore Pain Intervention(s): Limited activity within patient's tolerance, Monitored during session, Repositioned, Premedicated before session    Home Living Family/patient expects  to be discharged to:: Private residence Living Arrangements: Alone Available Help at Discharge: Family Type of Home: House Home Access: Stairs to enter Entrance Stairs-Rails: None Entrance Stairs-Number of Steps: 2   Home Layout: One level Home Equipment: Agricultural consultant (2 wheels);Cane - single point Additional Comments: has assist for    Prior Function            PT Goals  (current goals can now be found in the care plan section) Acute Rehab PT Goals Patient Stated Goal: walk normal again PT Goal Formulation: With patient Time For Goal Achievement: 10/16/23 Potential to Achieve Goals: Good Progress towards PT goals: Progressing toward goals    Frequency    7X/week      PT Plan      Co-evaluation              AM-PAC PT 6 Clicks Mobility   Outcome Measure  Help needed turning from your back to your side while in a flat bed without using bedrails?: A Little Help needed moving from lying on your back to sitting on the side of a flat bed without using bedrails?: A Little Help needed moving to and from a bed to a chair (including a wheelchair)?: A Little Help needed standing up from a chair using your arms (e.g., wheelchair or bedside chair)?: A Little Help needed to walk in hospital room?: A Little Help needed climbing 3-5 steps with a railing? : A Little 6 Click Score: 18    End of Session Equipment Utilized During Treatment: Gait belt Activity Tolerance: Patient tolerated treatment well Patient left: with call bell/phone within reach;in bed;with bed alarm set Nurse Communication: Mobility status PT Visit Diagnosis: Other abnormalities of gait and mobility (R26.89)     Time: 8462-8397 PT Time Calculation (min) (ACUTE ONLY): 25 min  Charges:    $Gait Training: 8-22 mins $Therapeutic Exercise: 8-22 mins PT General Charges $$ ACUTE PT VISIT: 1 Visit                     Rexene, PT  Acute Rehab Dept ([facility]) [phone]  10/09/2023    [facility] 10/09/2023, 4:08 PM

## 2023-10-09 NOTE — Plan of Care (Signed)
  Problem: Education: Goal: Knowledge of General Education information will improve Description: Including pain rating scale, medication(s)/side effects and non-pharmacologic comfort measures Outcome: Adequate for Discharge   Problem: Health Behavior/Discharge Planning: Goal: Ability to manage health-related needs will improve Outcome: Adequate for Discharge   Problem: Clinical Measurements: Goal: Ability to maintain clinical measurements within normal limits will improve Outcome: Progressing Goal: Will remain free from infection Outcome: Progressing Goal: Diagnostic test results will improve Outcome: Progressing Goal: Respiratory complications will improve Outcome: Progressing Goal: Cardiovascular complication will be avoided Outcome: Progressing   Problem: Activity: Goal: Risk for activity intolerance will decrease Outcome: Adequate for Discharge   Problem: Nutrition: Goal: Adequate nutrition will be maintained Outcome: Completed/Met   Problem: Coping: Goal: Level of anxiety will decrease Outcome: Progressing   Problem: Elimination: Goal: Will not experience complications related to bowel motility Outcome: Progressing Goal: Will not experience complications related to urinary retention Outcome: Completed/Met   Problem: Pain Managment: Goal: General experience of comfort will improve and/or be controlled Outcome: Progressing   Problem: Safety: Goal: Ability to remain free from injury will improve Outcome: Progressing   Problem: Health Behavior/Discharge Planning: Goal: Ability to manage health-related needs will improve Outcome: Progressing   Problem: Education: Goal: Knowledge of the prescribed therapeutic regimen will improve Outcome: Progressing Goal: Individualized Educational Video(s) Outcome: Completed/Met   Problem: Activity: Goal: Ability to avoid complications of mobility impairment will improve Outcome: Progressing Goal: Range of joint motion  will improve Outcome: Adequate for Discharge   Problem: Clinical Measurements: Goal: Postoperative complications will be avoided or minimized Outcome: Progressing   Problem: Pain Management: Goal: Pain level will decrease with appropriate interventions Outcome: Adequate for Discharge   Problem: Skin Integrity: Goal: Will show signs of wound healing Outcome: Progressing

## 2023-10-10 ENCOUNTER — Other Ambulatory Visit (HOSPITAL_COMMUNITY): Payer: Self-pay

## 2023-10-10 DIAGNOSIS — M1711 Unilateral primary osteoarthritis, right knee: Secondary | ICD-10-CM | POA: Diagnosis not present

## 2023-10-10 LAB — CBC
HCT: 39.1 % (ref 36.0–46.0)
Hemoglobin: 12.4 g/dL (ref 12.0–15.0)
MCH: 28.7 pg (ref 26.0–34.0)
MCHC: 31.7 g/dL (ref 30.0–36.0)
MCV: 90.5 fL (ref 80.0–100.0)
Platelets: 255 K/uL (ref 150–400)
RBC: 4.32 MIL/uL (ref 3.87–5.11)
RDW: 13 % (ref 11.5–15.5)
WBC: 13.3 K/uL — ABNORMAL HIGH (ref 4.0–10.5)
nRBC: 0 % (ref 0.0–0.2)

## 2023-10-10 MED ORDER — IBUPROFEN 400 MG PO TABS: 800.0000 mg | ORAL_TABLET | Freq: Three times a day (TID) | ORAL | Status: DC

## 2023-10-10 MED ORDER — CALCIUM CARBONATE ANTACID 500 MG PO CHEW
1.0000 | CHEWABLE_TABLET | Freq: Three times a day (TID) | ORAL | Status: DC | PRN
  Administered 2023-10-10: 400 mg via ORAL
  Filled 2023-10-10: qty 2

## 2023-10-10 MED ORDER — KETOROLAC TROMETHAMINE 15 MG/ML IJ SOLN
7.5000 mg | Freq: Four times a day (QID) | INTRAMUSCULAR | Status: AC
  Administered 2023-10-10 (×2): 7.5 mg via INTRAVENOUS
  Filled 2023-10-10 (×2): qty 1

## 2023-10-10 NOTE — Progress Notes (Signed)
 Physical Therapy Treatment Patient Details Name: Marie [name] MRN: [id number] DOB: 1945-07-19 Today's Date: 10/10/2023   History of Present Illness 78 yo female s/p R TKA on 10/09/23. PMH: PVD, ankle surgery,  shoulder fx, hand surgery    PT Comments  Patient reviewed up/down 1 step, caregiver present. Patient has met PT goals for Dc. Encouraged patient to slow speed and concentrate  when ambulating.    If plan is discharge home, recommend the following: A little help with walking and/or transfers;A little help with bathing/dressing/bathroom;Assist for transportation;Assistance with cooking/housework;Help with stairs or ramp for entrance   Can travel by private vehicle        Equipment Recommendations  None recommended by PT    Recommendations for Other Services       Precautions / Restrictions Precautions Precautions: Fall;Knee Recall of Precautions/Restrictions: Intact Restrictions RLE Weight Bearing Per Provider Order: Weight bearing as tolerated     Mobility  Bed Mobility               General bed mobility comments: in recliner    Transfers   Equipment used: Rolling walker (2 wheels) Transfers: Sit to/from Stand Sit to Stand: Supervision           General transfer comment: CGA for safety from recliner, cues for hand placement and RLE position    Ambulation/Gait Ambulation/Gait assistance: Supervision Gait Distance (Feet): 65 Feet Assistive device: Rolling walker (2 wheels) Gait Pattern/deviations: Step-to pattern, Decreased stance time - right Gait velocity: decr     General Gait Details: cues to roll RW as tolerated, cues to slow pace, concentrate   Stairs Stairs: Yes Stairs assistance: Min assist Stair Management: No rails, Step to pattern, Backwards, With walker   General stair comments: , practiced backward and did well. person helping present   Wheelchair Mobility     Tilt Bed    Modified Rankin (Stroke Patients Only)        Balance Overall balance assessment: Needs assistance Sitting-balance support: Feet supported, No upper extremity supported Sitting balance-Leahy Scale: Good     Standing balance support: During functional activity, Reliant on assistive device for balance Standing balance-Leahy Scale: Fair                              Hotel manager: No apparent difficulties  Cognition Arousal: Alert Behavior During Therapy: WFL for tasks assessed/performed, Anxious   PT - Cognitive impairments: No apparent impairments                         Following commands: Intact      Cueing Cueing Techniques: Verbal cues  Exercises   General Comments        Pertinent Vitals/Pain Pain Assessment Pain Score: 3  Pain Location: right knee Pain Descriptors / Indicators: Aching, Sore Pain Intervention(s): Monitored during session, Premedicated before session    Home Living                          Prior Function            PT Goals (current goals can now be found in the care plan section) Progress towards PT goals: Progressing toward goals    Frequency    7X/week      PT Plan      Co-evaluation  AM-PAC PT 6 Clicks Mobility   Outcome Measure  Help needed turning from your back to your side while in a flat bed without using bedrails?: None Help needed moving from lying on your back to sitting on the side of a flat bed without using bedrails?: None Help needed moving to and from a bed to a chair (including a wheelchair)?: A Little Help needed standing up from a chair using your arms (e.g., wheelchair or bedside chair)?: A Little Help needed to walk in hospital room?: A Little Help needed climbing 3-5 steps with a railing? : A Little 6 Click Score: 20    End of Session Equipment Utilized During Treatment: Gait belt Activity Tolerance: Patient tolerated treatment well Patient left: with call bell/phone  within reach;with chair alarm set;in chair;with family/visitor present Nurse Communication: Mobility status PT Visit Diagnosis: Other abnormalities of gait and mobility (R26.89)     Time: 1345-1410 PT Time Calculation (min) (ACUTE ONLY): 25 min  Charges:    $Gait Training: 23-37 mins  PT General Charges $$ ACUTE PT VISIT: 1 Visit                     [name] PT Acute Rehabilitation Services Office [phone]    [name] 10/10/2023, 2:46 PM

## 2023-10-10 NOTE — Progress Notes (Signed)
 Discharge instructions given to patient questions asked and answered  DFrankln RN

## 2023-10-10 NOTE — Progress Notes (Signed)
 Discharge instructions given to patient questions asked and answered. Sylvia Everts RN

## 2023-10-10 NOTE — Progress Notes (Signed)
 Physical Therapy Treatment Patient Details Name: Marie [name] MRN: [id number] DOB: December 22, 1945 Today's Date: 10/10/2023   History of Present Illness 78 yo female s/p R TKA on 10/09/23. PMH: PVD, ankle surgery,  shoulder fx, hand surgery    PT Comments  The patient is making progress with mobility, cues  for safe ambulation using RW. Patient practiced 1 step, will benefit from another session and review safety and step.    If plan is discharge home, recommend the following: A little help with walking and/or transfers;A little help with bathing/dressing/bathroom;Assist for transportation;Assistance with cooking/housework;Help with stairs or ramp for entrance   Can travel by private vehicle        Equipment Recommendations  None recommended by PT    Recommendations for Other Services       Precautions / Restrictions Precautions Precautions: Fall;Knee Recall of Precautions/Restrictions: Intact Restrictions RLE Weight Bearing Per Provider Order: Weight bearing as tolerated     Mobility  Bed Mobility               General bed mobility comments: in recliner    Transfers   Equipment used: Rolling walker (2 wheels) Transfers: Sit to/from Stand Sit to Stand: Contact guard assist           General transfer comment: CGA for safety from recliner, cues for hand placement and RLE position    Ambulation/Gait Ambulation/Gait assistance: Contact guard assist Gait Distance (Feet): 65 Feet Assistive device: Rolling walker (2 wheels) Gait Pattern/deviations: Step-to pattern, Decreased stance time - right Gait velocity: decr     General Gait Details: verbal cues for  RW position, knee flexion during swing as well as heel strike RLE on initial contact, tends to pick up RW   Stairs Stairs: Yes Stairs assistance: Min assist Stair Management: No rails, Step to pattern, Backwards, With walker   General stair comments: attermpted forward, unable to get  weight on UE,  practiced backward and did well.   Wheelchair Mobility     Tilt Bed    Modified Rankin (Stroke Patients Only)       Balance   Sitting-balance support: Feet supported, No upper extremity supported Sitting balance-Leahy Scale: Good     Standing balance support: During functional activity, Reliant on assistive device for balance Standing balance-Leahy Scale: Fair                              Hotel manager: No apparent difficulties  Cognition Arousal: Alert Behavior During Therapy: WFL for tasks assessed/performed, Anxious   PT - Cognitive impairments: No apparent impairments                         Following commands: Intact      Cueing Cueing Techniques: Verbal cues  Exercises Total Joint Exercises Quad Sets: AROM, Strengthening, Both, 10 reps Heel Slides: AAROM, Right, 10 reps Straight Leg Raises: AROM, AAROM, Strengthening, Right, 10 reps Goniometric ROM: 5-60 right knee flex    General Comments        Pertinent Vitals/Pain Pain Assessment Pain Score: 5  Pain Location: right knee Pain Descriptors / Indicators: Aching, Sore Pain Intervention(s): Monitored during session, Premedicated before session    Home Living                          Prior Function  PT Goals (current goals can now be found in the care plan section) Progress towards PT goals: Progressing toward goals    Frequency    7X/week      PT Plan      Co-evaluation              AM-PAC PT 6 Clicks Mobility   Outcome Measure  Help needed turning from your back to your side while in a flat bed without using bedrails?: None Help needed moving from lying on your back to sitting on the side of a flat bed without using bedrails?: None Help needed moving to and from a bed to a chair (including a wheelchair)?: A Little Help needed standing up from a chair using your arms (e.g., wheelchair or bedside chair)?:  A Little Help needed to walk in hospital room?: A Little Help needed climbing 3-5 steps with a railing? : A Little 6 Click Score: 20    End of Session Equipment Utilized During Treatment: Gait belt Activity Tolerance: Patient tolerated treatment well Patient left: with call bell/phone within reach;with chair alarm set;in chair Nurse Communication: Mobility status PT Visit Diagnosis: Other abnormalities of gait and mobility (R26.89)     Time: 9070-8995 PT Time Calculation (min) (ACUTE ONLY): 35 min  Charges:    $Gait Training: 8-22 mins There exer 8-22 PT General Charges $$ ACUTE PT VISIT: 1 Visit                     [name] PT Acute Rehabilitation Services Office [phone]   [name] 10/10/2023, 2:41 PM

## 2023-10-10 NOTE — Progress Notes (Signed)
 Subjective: 2 Days Post-Op Procedure(s) (LRB): ARTHROPLASTY, KNEE, TOTAL (Right) Patient reports pain as mild.   Patient seen in rounds for Dr. Ernie. Patient is well, and has had no acute complaints or problems. No acute events overnight. Pain was improved yesterday, particularly with toradol . She had no negative reaction to this.  We will start therapy today.   Objective: Vital signs in last 24 hours: Temp:  [97.4 F (36.3 C)-98.4 F (36.9 C)] 98 F (36.7 C) (08/07 0542) Pulse Rate:  [64-65] 65 (08/07 0542) Resp:  [15-18] 17 (08/07 0542) BP: (114-137)/(63-69) 133/69 (08/07 0542) SpO2:  [95 %-100 %] 100 % (08/07 0542)  Intake/Output from previous day:  Intake/Output Summary (Last 24 hours) at 10/10/2023 0717 Last data filed at 10/10/2023 0540 Gross per 24 hour  Intake 540 ml  Output 200 ml  Net 340 ml     Intake/Output this shift: No intake/output data recorded.  Labs: Recent Labs    10/09/23 0351 10/10/23 0336  HGB 13.3 12.4   Recent Labs    10/09/23 0351 10/10/23 0336  WBC 15.7* 13.3*  RBC 4.60 4.32  HCT 41.9 39.1  PLT 261 255   Recent Labs    10/09/23 0351  NA 136  K 3.5  CL 100  CO2 24  BUN 12  CREATININE 0.53  GLUCOSE 143*  CALCIUM  8.9   No results for input(s): LABPT, INR in the last 72 hours.  Exam: General - Patient is Alert and Oriented Extremity - Neurologically intact Sensation intact distally Intact pulses distally Dorsiflexion/Plantar flexion intact Dressing - dressing C/D/I Motor Function - intact, moving foot and toes well on exam.   Past Medical History:  Diagnosis Date   Allergy    SEASONAL   Arthritis    shoulder fracture , ankle    Blood transfusion without reported diagnosis    with pregnancy    Cataract    BEGINING STAGES   Fibrocystic breast    GERD (gastroesophageal reflux disease)    diet related    Hyperlipidemia    hx of and on red yeast rice    Hypertension    Neuromuscular disorder (HCC)    FROM  CHOLESTEROL MEDICINE   Osteopenia    Peripheral vascular disease (HCC)    PONV (postoperative nausea and vomiting)     Assessment/Plan: 2 Days Post-Op Procedure(s) (LRB): ARTHROPLASTY, KNEE, TOTAL (Right) Principal Problem:   S/P total knee replacement, right Active Problems:   S/P total knee arthroplasty, right  Estimated body mass index is 34.86 kg/m as calculated from the following:   Height as of this encounter: 5' 6 (1.676 m).   Weight as of this encounter: 98 kg. Advance diet Up with therapy D/C IV fluids   Patient's anticipated LOS is less than 2 midnights, meeting these requirements: - Younger than 36 - Lives within 1 hour of care - Has a competent adult at home to recover with post-op recover - NO history of  - Chronic pain requiring opiods  - Diabetes  - Coronary Artery Disease  - Heart failure  - Heart attack  - Stroke  - DVT/VTE  - Cardiac arrhythmia  - Respiratory Failure/COPD  - Renal failure  - Anemia  - Advanced Liver disease     DVT Prophylaxis - Aspirin  Weight bearing as tolerated.  Hgb stable at 12.4 this AM   Plan is to go Home after hospital stay. Plan for discharge today following 1-2 sessions of PT as long as they are meeting  their goals. Patient is scheduled for OPPT. Follow up in the office in 2 weeks.   Rosina Calin, PA-C Orthopedic Surgery [phone] 10/10/2023, 7:17 AM

## 2023-10-11 ENCOUNTER — Encounter (HOSPITAL_COMMUNITY): Payer: Self-pay | Admitting: Orthopedic Surgery

## 2023-10-11 DIAGNOSIS — M25561 Pain in right knee: Secondary | ICD-10-CM | POA: Diagnosis not present

## 2023-10-14 DIAGNOSIS — M25561 Pain in right knee: Secondary | ICD-10-CM | POA: Diagnosis not present

## 2023-10-15 NOTE — Discharge Summary (Signed)
 Patient ID: Marie [name] MRN: [id number] DOB/AGE: 08/20/1945 78 y.o.  Admit date: 10/08/2023 Discharge date: 10/10/2023  Admission Diagnoses:  Right knee osteoarthritis  Discharge Diagnoses:  Principal Problem:   S/P total knee replacement, right Active Problems:   S/P total knee arthroplasty, right   Past Medical History:  Diagnosis Date   Allergy    SEASONAL   Arthritis    shoulder fracture , ankle    Blood transfusion without reported diagnosis    with pregnancy    Cataract    BEGINING STAGES   Fibrocystic breast    GERD (gastroesophageal reflux disease)    diet related    Hyperlipidemia    hx of and on red yeast rice    Hypertension    Neuromuscular disorder (HCC)    FROM CHOLESTEROL MEDICINE   Osteopenia    Peripheral vascular disease (HCC)    PONV (postoperative nausea and vomiting)     Surgeries: Procedure(s): ARTHROPLASTY, KNEE, TOTAL on 10/08/2023   Consultants:   Discharged Condition: Improved  Hospital Course: Marie [name] is an 78 y.o. female who was admitted 10/08/2023 for operative treatment ofS/P total knee replacement, right. Patient has severe unremitting pain that affects sleep, daily activities, and work/hobbies. After pre-op clearance the patient was taken to the operating room on 10/08/2023 and underwent  Procedure(s): ARTHROPLASTY, KNEE, TOTAL.    Patient was given perioperative antibiotics:  Anti-infectives (From admission, onward)    Start     Dose/Rate Route Frequency Ordered Stop   10/09/23 0600  ceFAZolin  (ANCEF ) IVPB 2g/100 mL premix        2 g 200 mL/hr over 30 Minutes Intravenous On call to O.R. 10/08/23 1208 10/08/23 1426   10/08/23 2000  ceFAZolin  (ANCEF ) IVPB 2g/100 mL premix        2 g 200 mL/hr over 30 Minutes Intravenous Every 6 hours 10/08/23 1712 10/09/23 0508        Patient was given sequential compression devices, early ambulation, and chemoprophylaxis to prevent DVT. Patient worked with PT and was meeting their  goals regarding safe ambulation and transfers.  Patient benefited maximally from hospital stay and there were no complications.    Recent vital signs: No data found.   Recent laboratory studies: No results for input(s): WBC, HGB, HCT, PLT, NA, K, CL, CO2, BUN, CREATININE, GLUCOSE, INR, CALCIUM  in the last 72 hours.  Invalid input(s): PT, 2   Discharge Medications:   Allergies as of 10/10/2023       Reactions   Cholesterol    Muscle pains   Hydrocodone Hives   Unsure if true allergy as developed rash after a surgery years ago and had been given hydrocodone   Lipitor  [atorvastatin Calcium ]    Mobic [meloxicam]    Ankle swelling    Morphine And Codeine  Nausea And Vomiting   Nickel    Sulfa Antibiotics Hives   Codeine  Rash   Latex Rash   Naproxen Hives   Able to take Ibuprofen  w/o reaction Tolerated toradol  10/09/23   Penicillins Rash, Other (See Comments)   Tolerated Cephalosporin Date: 10/09/23.        Medication List     STOP taking these medications    aspirin  81 MG tablet Replaced by: Aspirin  Low Dose 81 MG chewable tablet       TAKE these medications    acetaminophen  500 MG tablet Commonly known as: TYLENOL  Take 2 tablets (1,000 mg total) by mouth every 6 (six) hours.   Aspirin  Low  Dose 81 MG chewable tablet Generic drug: aspirin  Chew 1 tablet (81 mg total) by mouth 2 (two) times daily for 28 days. Replaces: aspirin  81 MG tablet   hydrochlorothiazide  50 MG tablet Commonly known as: HYDRODIURIL  Take 25-50 mg by mouth daily. May take an additional 25mg s for shortness of breath/ fluid   ibuprofen  800 MG tablet Commonly known as: ADVIL  Take 1 tablet (800 mg total) by mouth every 8 (eight) hours.   methocarbamol  500 MG tablet Commonly known as: ROBAXIN  Take 1 tablet (500 mg total) by mouth every 6 (six) hours as needed for muscle spasms (thigh pain).   multivitamin with minerals Tabs tablet Take 1 tablet by mouth daily.    oxyCODONE  5 MG immediate release tablet Commonly known as: Oxy IR/ROXICODONE  Take 1 tablet (5 mg total) by mouth every 4 (four) hours as needed for severe pain (pain score 7-10).   polyethylene glycol powder 17 GM/SCOOP powder Commonly known as: GLYCOLAX /MIRALAX  Take 17 g dissolved in liquid by mouth 2 (two) times daily for 14 days.   potassium chloride  10 MEQ tablet Commonly known as: KLOR-CON  Take 10 mEq by mouth daily.   Red Yeast Rice 600 MG Tabs Take 1,200 mg by mouth daily.   senna 8.6 MG Tabs tablet Commonly known as: SENOKOT Take 2 tablets (17.2 mg total) by mouth at bedtime for 14 days.   Vitamin D 50 MCG (2000 UT) Caps Take 2,000 Units by mouth daily.               Discharge Care Instructions  (From admission, onward)           Start     Ordered   10/09/23 0000  Change dressing       Comments: Maintain surgical dressing until follow up in the clinic. If the edges start to pull up, may reinforce with tape. If the dressing is no longer working, may remove and cover with gauze and tape, but must keep the area dry and clean.  Call with any questions or concerns.   10/09/23 0744            Diagnostic Studies: No results found.  Disposition: Discharge disposition: 01-Home or Self Care       Discharge Instructions     Call MD / Call 911   Complete by: As directed    If you experience chest pain or shortness of breath, CALL 911 and be transported to the hospital emergency room.  If you develope a fever above 101 F, pus (white drainage) or increased drainage or redness at the wound, or calf pain, call your surgeon's office.   Change dressing   Complete by: As directed    Maintain surgical dressing until follow up in the clinic. If the edges start to pull up, may reinforce with tape. If the dressing is no longer working, may remove and cover with gauze and tape, but must keep the area dry and clean.  Call with any questions or concerns.    Constipation Prevention   Complete by: As directed    Drink plenty of fluids.  Prune juice may be helpful.  You may use a stool softener, such as Colace (over the counter) 100 mg twice a day.  Use MiraLax  (over the counter) for constipation as needed.   Diet - low sodium heart healthy   Complete by: As directed    Increase activity slowly as tolerated   Complete by: As directed    Weight bearing as tolerated  with assist device (walker, cane, etc) as directed, use it as long as suggested by your surgeon or therapist, typically at least 4-6 weeks.   Post-operative opioid taper instructions:   Complete by: As directed    POST-OPERATIVE OPIOID TAPER INSTRUCTIONS: It is important to wean off of your opioid medication as soon as possible. If you do not need pain medication after your surgery it is ok to stop day one. Opioids include: Codeine , Hydrocodone(Norco, Vicodin), Oxycodone (Percocet, oxycontin ) and hydromorphone  amongst others.  Long term and even short term use of opiods can cause: Increased pain response Dependence Constipation Depression Respiratory depression And more.  Withdrawal symptoms can include Flu like symptoms Nausea, vomiting And more Techniques to manage these symptoms Hydrate well Eat regular healthy meals Stay active Use relaxation techniques(deep breathing, meditating, yoga) Do Not substitute Alcohol to help with tapering If you have been on opioids for less than two weeks and do not have pain than it is ok to stop all together.  Plan to wean off of opioids This plan should start within one week post op of your joint replacement. Maintain the same interval or time between taking each dose and first decrease the dose.  Cut the total daily intake of opioids by one tablet each day Next start to increase the time between doses. The last dose that should be eliminated is the evening dose.      TED hose   Complete by: As directed    Use stockings (TED hose) for  2 weeks on both leg(s).  You may remove them at night for sleeping.        Follow-up Information     [facility] Go on 10/11/2023.   Why: You are scheduled for physical therapy Friday 10/11/23 at 10:30am [address] Contact information: [address] [place] [address] [phone]         Patti Rosina SAUNDERS, PA-C. Go on 10/24/2023.   Specialty: Orthopedic Surgery Why: You are scheduled for a post op appointment on Thursday 10/24/23 at 3:15pm Contact information: [address] [place] [address] [phone]                  Signed: Rosina SAUNDERS Patti 10/15/2023, 10:37 AM

## 2023-10-16 DIAGNOSIS — M25561 Pain in right knee: Secondary | ICD-10-CM | POA: Diagnosis not present

## 2023-10-18 ENCOUNTER — Encounter (HOSPITAL_BASED_OUTPATIENT_CLINIC_OR_DEPARTMENT_OTHER): Payer: Self-pay

## 2023-10-18 ENCOUNTER — Emergency Department (HOSPITAL_BASED_OUTPATIENT_CLINIC_OR_DEPARTMENT_OTHER)

## 2023-10-18 ENCOUNTER — Emergency Department (HOSPITAL_BASED_OUTPATIENT_CLINIC_OR_DEPARTMENT_OTHER)
Admission: EM | Admit: 2023-10-18 | Discharge: 2023-10-18 | Disposition: A | Discharge status: Home / Self Care | Attending: Emergency Medicine | Admitting: Emergency Medicine

## 2023-10-18 ENCOUNTER — Other Ambulatory Visit: Payer: Self-pay

## 2023-10-18 DIAGNOSIS — R2241 Localized swelling, mass and lump, right lower limb: Secondary | ICD-10-CM | POA: Diagnosis not present

## 2023-10-18 DIAGNOSIS — I10 Essential (primary) hypertension: Secondary | ICD-10-CM | POA: Insufficient documentation

## 2023-10-18 DIAGNOSIS — M25561 Pain in right knee: Secondary | ICD-10-CM | POA: Diagnosis not present

## 2023-10-18 DIAGNOSIS — Z96651 Presence of right artificial knee joint: Secondary | ICD-10-CM | POA: Insufficient documentation

## 2023-10-18 DIAGNOSIS — Z79899 Other long term (current) drug therapy: Secondary | ICD-10-CM | POA: Diagnosis not present

## 2023-10-18 DIAGNOSIS — M7121 Synovial cyst of popliteal space [Baker], right knee: Secondary | ICD-10-CM | POA: Diagnosis not present

## 2023-10-18 DIAGNOSIS — Z7982 Long term (current) use of aspirin: Secondary | ICD-10-CM | POA: Diagnosis not present

## 2023-10-18 DIAGNOSIS — M79661 Pain in right lower leg: Secondary | ICD-10-CM | POA: Diagnosis not present

## 2023-10-18 DIAGNOSIS — M7989 Other specified soft tissue disorders: Secondary | ICD-10-CM | POA: Insufficient documentation

## 2023-10-18 DIAGNOSIS — Z91040 Latex allergy status: Secondary | ICD-10-CM | POA: Insufficient documentation

## 2023-10-18 NOTE — ED Triage Notes (Signed)
 Pt had R knee replacement x1 week ago. Pt reports R calf pain x2 days ago. Pt reports taking x2 ASA daily.

## 2023-10-18 NOTE — ED Provider Notes (Addendum)
 [place] [facility] AT [facility] Provider Note   CSN: [id number] Arrival date & time: 10/18/23  1701     Patient presents with: Leg Pain   Marie DABBS is a 78 y.o. female.   Patient here with concerns for blood clot to the right leg.  A lot of right calf pain for the past 2 days.  Status post knee replacement surgery done by Dr. Emmit on August 5.  Patient states there is swelling no increased redness.  Has follow-up with Dr. Emmit this week.  Temp here 97.7.  Past medical history sniffing for hypertension fibrocystic breast hyperlipidemia peripheral vascular disease.  Patient states that feeling in her right foot is normal there is some swelling but it is all normal.  No pain in the foot.       Prior to Admission medications   Medication Sig Start Date End Date Taking? Authorizing Provider  acetaminophen  (TYLENOL ) 500 MG tablet Take 2 tablets (1,000 mg total) by mouth every 6 (six) hours. 10/09/23   Patti Rosina SAUNDERS, PA-C  aspirin  81 MG chewable tablet Chew 1 tablet (81 mg total) by mouth 2 (two) times daily for 28 days. 10/09/23 11/07/23  Patti Rosina SAUNDERS, PA-C  Cholecalciferol (VITAMIN D) 2000 units CAPS Take 2,000 Units by mouth daily.    [provider]  hydrochlorothiazide  (HYDRODIURIL ) 50 MG tablet Take 25-50 mg by mouth daily. May take an additional 25mg s for shortness of breath/ fluid    [provider]  ibuprofen  (ADVIL ) 800 MG tablet Take 1 tablet (800 mg total) by mouth every 8 (eight) hours. 10/09/23 11/09/23  Patti Rosina SAUNDERS, PA-C  methocarbamol  (ROBAXIN ) 500 MG tablet Take 1 tablet (500 mg total) by mouth every 6 (six) hours as needed for muscle spasms (thigh pain). 10/09/23   Patti Rosina SAUNDERS, PA-C  Multiple Vitamin (MULTIVITAMIN WITH MINERALS) TABS tablet Take 1 tablet by mouth daily.    [provider]  oxyCODONE  (OXY IR/ROXICODONE ) 5 MG immediate release tablet Take 1 tablet (5 mg total) by mouth every 4 (four) hours as  needed for severe pain (pain score 7-10). 10/09/23   Patti Rosina SAUNDERS, PA-C  polyethylene glycol powder (GLYCOLAX /MIRALAX ) 17 GM/SCOOP powder Take 17 g dissolved in liquid by mouth 2 (two) times daily for 14 days. 10/09/23 10/24/23  Patti Rosina SAUNDERS, PA-C  potassium chloride  (K-DUR) 10 MEQ tablet Take 10 mEq by mouth daily.    [provider]  Red Yeast Rice 600 MG TABS Take 1,200 mg by mouth daily.    [provider]  senna (SENOKOT) 8.6 MG TABS tablet Take 2 tablets (17.2 mg total) by mouth at bedtime for 14 days. 10/09/23 10/24/23  Patti Rosina SAUNDERS, PA-C    Allergies: Cholesterol, Hydrocodone, Lipitor  [atorvastatin calcium ], Mobic [meloxicam], Morphine and codeine , Nickel, Sulfa antibiotics, Codeine , Latex, Naproxen, Penicillins, and Plasticized base [plastibase]    Review of Systems  Constitutional:  Negative for chills and fever.  HENT:  Negative for ear pain and sore throat.   Eyes:  Negative for pain and visual disturbance.  Respiratory:  Negative for cough and shortness of breath.   Cardiovascular:  Positive for leg swelling. Negative for chest pain and palpitations.  Gastrointestinal:  Negative for abdominal pain and vomiting.  Genitourinary:  Negative for dysuria and hematuria.  Musculoskeletal:  Negative for arthralgias and back pain.  Skin:  Negative for color change and rash.  Neurological:  Negative for seizures and syncope.  All other systems reviewed and  are negative.   Updated Vital Signs BP (!) 174/76   Pulse 74   Temp 97.7 F (36.5 C)   Resp 18   Ht 1.676 m (5' 6)   Wt 98.9 kg   SpO2 96%   BMI 35.19 kg/m   Physical Exam Vitals and nursing note reviewed.  Constitutional:      General: She is not in acute distress.    Appearance: Normal appearance. She is well-developed.  HENT:     Head: Normocephalic and atraumatic.  Eyes:     Conjunctiva/sclera: Conjunctivae normal.  Cardiovascular:     Rate and Rhythm: Normal rate and regular rhythm.      Heart sounds: No murmur heard. Pulmonary:     Effort: Pulmonary effort is normal. No respiratory distress.     Breath sounds: Normal breath sounds.  Abdominal:     Palpations: Abdomen is soft.     Tenderness: There is no abdominal tenderness.  Musculoskeletal:        General: Swelling present.     Cervical back: Neck supple.     Comments: Swelling around the right knee.  Bandage in place.  Distally some swelling at the ankle no erythema.  Neurovascularly intact.  Dressing not removed.  But no significant surrounding erythema  Skin:    General: Skin is warm and dry.     Capillary Refill: Capillary refill takes less than 2 seconds.  Neurological:     General: No focal deficit present.     Mental Status: She is alert and oriented to person, place, and time.  Psychiatric:        Mood and Affect: Mood normal.     (all labs ordered are listed, but only abnormal results are displayed) Labs Reviewed - No data to display  EKG: None  Radiology: US  Venous Img Lower Unilateral Right Result Date: 10/18/2023 CLINICAL DATA:  Right posterior knee/calf pain with shortness of breath x2 days. EXAM: RIGHT LOWER EXTREMITY VENOUS DOPPLER ULTRASOUND TECHNIQUE: Gray-scale sonography with graded compression, as well as color Doppler and duplex ultrasound were performed to evaluate the lower extremity deep venous systems from the level of the common femoral vein and including the common femoral, femoral, profunda femoral, popliteal and calf veins including the posterior tibial, peroneal and gastrocnemius veins when visible. The superficial great saphenous vein was also interrogated. Spectral Doppler was utilized to evaluate flow at rest and with distal augmentation maneuvers in the common femoral, femoral and popliteal veins. COMPARISON:  None Available. FINDINGS: Contralateral Common Femoral Vein: Respiratory phasicity is normal and symmetric with the symptomatic side. No evidence of thrombus. Normal  compressibility. Common Femoral Vein: No evidence of thrombus. Normal compressibility, respiratory phasicity and response to augmentation. Saphenofemoral Junction: No evidence of thrombus. Normal compressibility and flow on color Doppler imaging. Profunda Femoral Vein: No evidence of thrombus. Normal compressibility and flow on color Doppler imaging. Femoral Vein: No evidence of thrombus. Normal compressibility, respiratory phasicity and response to augmentation. Popliteal Vein: No evidence of thrombus. Normal compressibility, respiratory phasicity and response to augmentation. Calf Veins: The RIGHT posterior tibial vein and RIGHT peroneal vein are limited in visualization. Superficial Great Saphenous Vein: No evidence of thrombus. Normal compressibility. Venous Reflux:  None. Other Findings: A 1.7 cm x 2.0 cm x 2.0 cm fluid collection is seen within the medial aspect of the right popliteal fossa. IMPRESSION: 1. Limited evaluation of the RIGHT posterior tibial and RIGHT peroneal veins, without evidence of deep venous thrombosis within the RIGHT lower extremity. 2.  RIGHT Baker's cyst. Electronically Signed   By: Suzen Dials M.D.   On: 10/18/2023 20:34     Procedures   Medications Ordered in the ED - No data to display                                  Medical Decision Making  Doppler shows no evidence of DVT.  Does show a Baker's cyst.  But I think most of her symptoms are probably secondary to the total knee replacement.  She has follow-up with orthopedics this week.  Patient stable for discharge home  Final diagnoses:  Swelling of right lower extremity  Status post total knee replacement, right    ED Discharge Orders     None          Geraldene Hamilton, MD 10/18/23 2249    Geraldene Hamilton, MD 10/18/23 2250

## 2023-10-18 NOTE — Discharge Instructions (Signed)
 Keep your appointment to follow-up with orthopedics.  Doppler study here without evidence of deep vein thrombosis.

## 2023-10-21 ENCOUNTER — Encounter (HOSPITAL_COMMUNITY)

## 2023-10-21 DIAGNOSIS — M25561 Pain in right knee: Secondary | ICD-10-CM | POA: Diagnosis not present

## 2023-10-23 DIAGNOSIS — M25561 Pain in right knee: Secondary | ICD-10-CM | POA: Diagnosis not present

## 2023-10-25 DIAGNOSIS — M25561 Pain in right knee: Secondary | ICD-10-CM | POA: Diagnosis not present

## 2023-10-28 DIAGNOSIS — M25561 Pain in right knee: Secondary | ICD-10-CM | POA: Diagnosis not present

## 2023-10-29 ENCOUNTER — Other Ambulatory Visit (HOSPITAL_COMMUNITY): Payer: Self-pay

## 2023-10-30 DIAGNOSIS — M25561 Pain in right knee: Secondary | ICD-10-CM | POA: Diagnosis not present

## 2023-11-06 DIAGNOSIS — M25561 Pain in right knee: Secondary | ICD-10-CM | POA: Diagnosis not present

## 2023-11-08 DIAGNOSIS — M25561 Pain in right knee: Secondary | ICD-10-CM | POA: Diagnosis not present

## 2023-11-11 DIAGNOSIS — M25561 Pain in right knee: Secondary | ICD-10-CM | POA: Diagnosis not present

## 2023-11-15 DIAGNOSIS — M25561 Pain in right knee: Secondary | ICD-10-CM | POA: Diagnosis not present

## 2023-11-18 DIAGNOSIS — M25561 Pain in right knee: Secondary | ICD-10-CM | POA: Diagnosis not present

## 2023-11-20 ENCOUNTER — Other Ambulatory Visit: Payer: Self-pay

## 2023-11-22 DIAGNOSIS — M25561 Pain in right knee: Secondary | ICD-10-CM | POA: Diagnosis not present

## 2023-11-26 DIAGNOSIS — M25561 Pain in right knee: Secondary | ICD-10-CM | POA: Diagnosis not present

## 2023-11-27 DIAGNOSIS — Z5189 Encounter for other specified aftercare: Secondary | ICD-10-CM | POA: Diagnosis not present

## 2023-12-06 DIAGNOSIS — M25561 Pain in right knee: Secondary | ICD-10-CM | POA: Diagnosis not present

## 2023-12-16 DIAGNOSIS — M25561 Pain in right knee: Secondary | ICD-10-CM | POA: Diagnosis not present

## 2024-01-03 DIAGNOSIS — M25561 Pain in right knee: Secondary | ICD-10-CM | POA: Diagnosis not present

## 2024-01-06 DIAGNOSIS — H5213 Myopia, bilateral: Secondary | ICD-10-CM | POA: Diagnosis not present

## 2024-01-06 DIAGNOSIS — H2513 Age-related nuclear cataract, bilateral: Secondary | ICD-10-CM | POA: Diagnosis not present

## 2024-01-17 DIAGNOSIS — Z0000 Encounter for general adult medical examination without abnormal findings: Secondary | ICD-10-CM | POA: Diagnosis not present

## 2024-01-17 DIAGNOSIS — M170 Bilateral primary osteoarthritis of knee: Secondary | ICD-10-CM | POA: Diagnosis not present

## 2024-01-17 DIAGNOSIS — E559 Vitamin D deficiency, unspecified: Secondary | ICD-10-CM | POA: Diagnosis not present

## 2024-01-17 DIAGNOSIS — I10 Essential (primary) hypertension: Secondary | ICD-10-CM | POA: Diagnosis not present

## 2024-01-17 DIAGNOSIS — Z23 Encounter for immunization: Secondary | ICD-10-CM | POA: Diagnosis not present

## 2024-01-17 DIAGNOSIS — E7800 Pure hypercholesterolemia, unspecified: Secondary | ICD-10-CM | POA: Diagnosis not present
